# Patient Record
Sex: Male | Born: 1960 | Race: White | Hispanic: No | Marital: Married | Smoking: Never smoker
Health system: Southern US, Community
[De-identification: ages and names within clinical notes are randomized; demographics above are authoritative.]

## PROBLEM LIST (undated history)

## (undated) DIAGNOSIS — G4733 Obstructive sleep apnea (adult) (pediatric): Secondary | ICD-10-CM

## (undated) DIAGNOSIS — B351 Tinea unguium: Secondary | ICD-10-CM

## (undated) DIAGNOSIS — Z872 Personal history of diseases of the skin and subcutaneous tissue: Secondary | ICD-10-CM

## (undated) DIAGNOSIS — N503 Cyst of epididymis: Secondary | ICD-10-CM

## (undated) DIAGNOSIS — I480 Paroxysmal atrial fibrillation: Secondary | ICD-10-CM

## (undated) DIAGNOSIS — J309 Allergic rhinitis, unspecified: Secondary | ICD-10-CM

## (undated) DIAGNOSIS — Z8739 Personal history of other diseases of the musculoskeletal system and connective tissue: Secondary | ICD-10-CM

## (undated) HISTORY — DX: Cyst of epididymis: N50.3

## (undated) HISTORY — PX: SHOULDER ARTHROSCOPY: SHX128

## (undated) HISTORY — DX: Paroxysmal atrial fibrillation: I48.0

## (undated) HISTORY — DX: Tinea unguium: B35.1

## (undated) HISTORY — DX: Obstructive sleep apnea (adult) (pediatric): G47.33

## (undated) HISTORY — DX: Personal history of other diseases of the musculoskeletal system and connective tissue: Z87.39

## (undated) HISTORY — DX: Personal history of diseases of the skin and subcutaneous tissue: Z87.2

## (undated) HISTORY — DX: Allergic rhinitis, unspecified: J30.9

## (undated) HISTORY — PX: HERNIA REPAIR: SHX51

## (undated) HISTORY — PX: COLONOSCOPY: SHX174

---

## 2006-12-15 ENCOUNTER — Encounter: Admission: RE | Admit: 2006-12-15 | Discharge: 2006-12-15 | Payer: Self-pay | Admitting: Orthopedic Surgery

## 2007-12-16 ENCOUNTER — Emergency Department (HOSPITAL_BASED_OUTPATIENT_CLINIC_OR_DEPARTMENT_OTHER): Admission: EM | Admit: 2007-12-16 | Discharge: 2007-12-16 | Payer: Self-pay | Admitting: Emergency Medicine

## 2010-10-17 LAB — BASIC METABOLIC PANEL
Creatinine, Ser: 1 mg/dL (ref 0.4–1.5)
GFR calc non Af Amer: >60 mL/min (ref >60)

## 2010-10-17 LAB — DIFFERENTIAL
Basophils Absolute: 0.1 10*3/uL (ref 0.0–0.1)
Basophils Relative: 2 % — ABNORMAL HIGH (ref 0–1)
Eosinophils Absolute: 0.1 10*3/uL (ref 0.0–0.7)
Eosinophils Relative: 2 % (ref 0–5)
Lymphocytes Relative: 27 % (ref 12–46)
Lymphs Abs: 1.5 10*3/uL (ref 0.7–4.0)
Monocytes Absolute: 0.5 10*3/uL (ref 0.1–1.0)
Neutro Abs: 3.5 10*3/uL (ref 1.7–7.7)

## 2010-10-17 LAB — CBC
MCHC: 33.9 g/dL (ref 30.0–36.0)
MCV: 89 fL (ref 78.0–100.0)
Platelets: 298 10*3/uL (ref 150–400)
RBC: 4.83 MIL/uL (ref 4.22–5.81)
RDW: 12.6 % (ref 11.5–15.5)

## 2011-07-14 ENCOUNTER — Other Ambulatory Visit: Payer: Self-pay | Admitting: Otolaryngology

## 2011-07-14 DIAGNOSIS — R42 Dizziness and giddiness: Secondary | ICD-10-CM

## 2011-07-21 ENCOUNTER — Other Ambulatory Visit: Payer: Self-pay

## 2011-07-23 ENCOUNTER — Ambulatory Visit
Admission: RE | Admit: 2011-07-23 | Discharge: 2011-07-23 | Disposition: A | Discharge status: Home / Self Care | Payer: BC Managed Care – PPO | Source: Ambulatory Visit | Attending: Otolaryngology | Admitting: Otolaryngology

## 2011-07-23 DIAGNOSIS — R42 Dizziness and giddiness: Secondary | ICD-10-CM

## 2011-07-23 MED ORDER — GADOBENATE DIMEGLUMINE 529 MG/ML IV SOLN
18.0000 mL | Freq: Once | INTRAVENOUS | Status: AC | PRN
  Administered 2011-07-23: 18 mL via INTRAVENOUS

## 2011-10-19 ENCOUNTER — Encounter: Payer: Self-pay | Admitting: Internal Medicine

## 2011-10-19 ENCOUNTER — Ambulatory Visit (INDEPENDENT_AMBULATORY_CARE_PROVIDER_SITE_OTHER): Payer: BC Managed Care – PPO | Admitting: Internal Medicine

## 2011-10-19 VITALS — BP 127/75 | HR 55 | Ht 75.0 in | Wt 193.8 lb

## 2011-10-19 DIAGNOSIS — R0789 Other chest pain: Secondary | ICD-10-CM | POA: Insufficient documentation

## 2011-10-19 DIAGNOSIS — I4891 Unspecified atrial fibrillation: Secondary | ICD-10-CM | POA: Insufficient documentation

## 2011-10-19 NOTE — Assessment & Plan Note (Signed)
His symptoms are not clearly exertional. He will undergo nuclear stress testing in the next several days. If his stress test is negative, then I would not pursue additional cardiac ischemia workup.

## 2011-10-19 NOTE — Progress Notes (Signed)
HPI Shawn Fields is referred today by Dr. Dorris Fetch for evaluation of palpitations and atrial fibrillation. The patient's history dates back to an exercise treadmill test that he underwent after he turned 51 years of age. During the treadmill, he was said to go into atrial tachycardia. Initially he was not aware of his symptoms. Subsequent evaluation demonstrated normal left ventricular dysfunction with mild tricuspid and mitral valve regurgitation. Because of a history of snoring and his atrial arrhythmias, he underwent sleep evaluation demonstrating sleep apnea. The patient has subsequently undergone a cardiac monitor which demonstrates runs of atrial fibrillation, atrial tachycardia, and what appears to be atrial flutter. It is difficult to know whether he is symptomatic from these are not only does complain of intermittent palpitations. He has not had syncope. The patient notes occasional episodes of chest discomfort which are nonexertional. No relationship to diet. Initially on his beta blocker, he felt some mild fatigue and weakness. This has improved. Despite his beta blocker, he has continued palpitations. No Known Allergies   Current Outpatient Prescriptions  Medication Sig Dispense Refill  . aspirin 81 MG tablet Take 81 mg by mouth daily.      . fluticasone (FLONASE) 50 MCG/ACT nasal spray Place 2 sprays into the nose daily.      Marland Kitchen loratadine (CLARITIN) 10 MG tablet Take 10 mg by mouth daily.      . meclizine (ANTIVERT) 25 MG tablet Take 25 mg by mouth 3 (three) times daily as needed.      . metoprolol succinate (TOPROL-XL) 25 MG 24 hr tablet Take 25 mg by mouth daily.      . promethazine (PHENERGAN) 25 MG tablet Take 25 mg by mouth every 6 (six) hours as needed.         No past medical history on file.  ROS:   All systems reviewed and negative except as noted in the HPI.   No past surgical history on file.   No family history on file.   History   Social History  . Marital  Status: Married    Spouse Name: N/A    Number of Children: N/A  . Years of Education: N/A   Occupational History  . Not on file.   Social History Main Topics  . Smoking status: Never Smoker   . Smokeless tobacco: Not on file  . Alcohol Use: Not on file  . Drug Use: Not on file  . Sexually Active: Not on file   Other Topics Concern  . Not on file   Social History Narrative  . No narrative on file     BP 127/75  Pulse 55  Ht 6\' 3"  (1.905 m)  Wt 193 lb 12.8 oz (87.907 kg)  BMI 24.22 kg/m2  Physical Exam:  Well appearing middle-aged man, NAD HEENT: Unremarkable Neck:  No JVD, no thyromegally Lungs:  Clear with no wheezes, rales, or rhonchi. HEART:  Regular rate rhythm, no murmurs, no rubs, no clicks, no murmurs. Abd:  soft, positive bowel sounds, no organomegally, no rebound, no guarding Ext:  2 plus pulses, no edema, no cyanosis, no clubbing Skin:  No rashes no nodules Neuro:  CN II through XII intact, motor grossly intact  Cardiac monitor - multiple runs of atrial fibrillation, tachycardia, and atrial flutter.  Assess/Plan:

## 2011-10-19 NOTE — Assessment & Plan Note (Signed)
Today we discussed the treatment options in detail. Medical therapy including the indication for beta blockers as well as antiarrhythmic drug therapy using flecainide as well as catheter ablation were discussed. While his young age suggest that he may ultimately need catheter ablation to control his arrhythmias, I've recommended that the patient start flecainide after he has undergone stress testing which was scheduled to be done in 3 days. If he had evidence of ischemia on stress testing, and left heart catheterization would be indicated. Obviously this would preclude the use of flecainide. In this setting, a medication like Multaq would be a consideration.

## 2011-10-26 ENCOUNTER — Encounter: Payer: Self-pay | Admitting: Internal Medicine

## 2011-10-29 ENCOUNTER — Other Ambulatory Visit: Payer: Self-pay | Admitting: *Deleted

## 2011-10-29 DIAGNOSIS — I4891 Unspecified atrial fibrillation: Secondary | ICD-10-CM

## 2011-11-02 ENCOUNTER — Other Ambulatory Visit: Payer: Self-pay | Admitting: *Deleted

## 2011-11-02 MED ORDER — FLECAINIDE ACETATE 50 MG PO TABS: 50.0000 mg | ORAL_TABLET | Freq: Two times a day (BID) | ORAL | Status: DC

## 2011-11-06 ENCOUNTER — Encounter: Payer: Self-pay | Admitting: Internal Medicine

## 2011-11-10 ENCOUNTER — Encounter: Payer: Self-pay | Admitting: Internal Medicine

## 2011-11-12 ENCOUNTER — Ambulatory Visit (INDEPENDENT_AMBULATORY_CARE_PROVIDER_SITE_OTHER): Payer: BC Managed Care – PPO | Admitting: *Deleted

## 2011-11-12 ENCOUNTER — Encounter: Payer: BC Managed Care – PPO | Admitting: Internal Medicine

## 2011-11-12 ENCOUNTER — Encounter: Payer: Self-pay | Admitting: *Deleted

## 2011-11-12 VITALS — BP 120/80 | HR 52 | Resp 18

## 2011-11-12 DIAGNOSIS — I4891 Unspecified atrial fibrillation: Secondary | ICD-10-CM

## 2011-11-12 NOTE — Progress Notes (Signed)
Patient here for EKG per Dr.Taylor. He feels well and tolerating Flecainide at 50mg  2 times per day. BP was 120/80 NSR rate of 52. EKG reviewed by Dr.Taylor. Patient will return for GXT in November.

## 2011-11-12 NOTE — Patient Instructions (Signed)
Return for GXT with Dr.Taylor.

## 2011-11-20 ENCOUNTER — Ambulatory Visit (INDEPENDENT_AMBULATORY_CARE_PROVIDER_SITE_OTHER): Payer: BC Managed Care – PPO | Admitting: Internal Medicine

## 2011-11-20 DIAGNOSIS — R0789 Other chest pain: Secondary | ICD-10-CM

## 2011-11-20 DIAGNOSIS — I4891 Unspecified atrial fibrillation: Secondary | ICD-10-CM

## 2011-11-20 NOTE — Progress Notes (Signed)
Exercise Treadmill Test  Pre-Exercise Testing Evaluation Rhythm: normal sinus  Rate: 64   PR:  .17 QRS:  .10  QT:  .39 QTc: .40            Test  Exercise Tolerance Test Ordering MD: Lewayne Bunting, MD  Interpreting MD: Lewayne Bunting, MD  Unique Test No: 1  Treadmill:  1  Indication for ETT: chest pain - rule out ischemia  Contraindication to ETT: No   Stress Modality: exercise - treadmill  Cardiac Imaging Performed: non   Protocol: standard Bruce - maximal  Max BP:  173/114  Max MPHR (bpm):  170 85% MPR (bpm):  145  MPHR obtained (bpm):  129 % MPHR obtained:  75  Reached 85% MPHR (min:sec):  -- Total Exercise Time (min-sec):  12:00  Workload in METS:  13.7 Borg Scale: 14  Reason ETT Terminated:  Appropriate response reached    ST Segment Analysis At Rest: normal ST segments - no evidence of significant ST depression With Exercise: no evidence of significant ST depression  Other Information Arrhythmia:  No Angina during ETT:  absent (0) Quality of ETT:  diagnostic  ETT Interpretation:  normal - no evidence of ischemia by ST analysis  Comments: Clinically and electrically negative with no inducible arrhythmias  Recommendations: Continue flecainide and other medications.

## 2012-01-05 ENCOUNTER — Encounter: Payer: Self-pay | Admitting: Internal Medicine

## 2012-04-14 ENCOUNTER — Ambulatory Visit (INDEPENDENT_AMBULATORY_CARE_PROVIDER_SITE_OTHER): Payer: BC Managed Care – PPO | Admitting: Internal Medicine

## 2012-04-14 ENCOUNTER — Encounter: Payer: Self-pay | Admitting: Internal Medicine

## 2012-04-14 VITALS — BP 106/60 | HR 65 | Ht 75.0 in | Wt 193.2 lb

## 2012-04-14 DIAGNOSIS — R0789 Other chest pain: Secondary | ICD-10-CM

## 2012-04-14 DIAGNOSIS — I4891 Unspecified atrial fibrillation: Secondary | ICD-10-CM

## 2012-04-14 MED ORDER — FLECAINIDE ACETATE 100 MG PO TABS: ORAL_TABLET | ORAL | Status: DC

## 2012-04-14 NOTE — Patient Instructions (Addendum)
Your physician wants you to follow-up in: 6 months with Dr Court Joy will receive a reminder letter in the mail two months in advance. If you don't receive a letter, please call our office to schedule the follow-up appointment.    Your physician has recommended you make the following change in your medication:  1) Increase Flecainide to 100mg  at noon and 50mg  at night

## 2012-04-14 NOTE — Assessment & Plan Note (Signed)
His symptoms are nonexertional, and he has had a negative stress test in the past. I've recommended watchful waiting.

## 2012-04-14 NOTE — Assessment & Plan Note (Signed)
We have recommended that the patient increase his dose of flecainide to a 100 mg in the morning, and 50 mg in the evening. Additional up titration may be required.

## 2012-04-14 NOTE — Progress Notes (Signed)
HPI [name] returns today for followup. He is a very pleasant middle-age man with a history of paroxysmal atrial fibrillation, and vertigo. The patient has been on flecainide therapy at 50 mg twice daily. Over the last couple weeks, he has had increasing palpitations in the evening before he goes to bed. He denies syncope or chest pain. In addition, the patient has had problems with chronic vertigo. He notes that the room spins and he becomes dizzy and occasionally nauseated. These symptoms began over 5 years ago but have recently become more frequent. Finally, he notes an occasional dull ache in his chest. This is clearly nonexertional. No associated diaphoresis, nausea, or shortness of breath. No relationship to position. No Known Allergies   Current Outpatient Prescriptions  Medication Sig Dispense Refill  . acetic acid (VOSOL) 2 % otic solution Place 2 drops into the right ear 4 (four) times daily.       Marland Kitchen aspirin 81 MG tablet Take 81 mg by mouth daily.      . flecainide (TAMBOCOR) 100 MG tablet Take 1 tablet at lunch (12 noon) and  1/2 at night  180 tablet  3  . fluticasone (FLONASE) 50 MCG/ACT nasal spray Place 1 spray into the nose daily.       Marland Kitchen loratadine (CLARITIN) 10 MG tablet Take 10 mg by mouth daily.      . meclizine (ANTIVERT) 25 MG tablet Take 25 mg by mouth 3 (three) times daily as needed.      . metoprolol succinate (TOPROL-XL) 25 MG 24 hr tablet Take 25 mg by mouth daily.      . promethazine (PHENERGAN) 25 MG tablet Take 25 mg by mouth every 6 (six) hours as needed.       No current facility-administered medications for this visit.     History reviewed. No pertinent past medical history.  ROS:   All systems reviewed and negative except as noted in the HPI.   History reviewed. No pertinent past surgical history.   No family history on file.   History   Social History  . Marital Status: Married    Spouse Name: N/A    Number of Children: N/A  . Years of  Education: N/A   Occupational History  . Not on file.   Social History Main Topics  . Smoking status: Never Smoker   . Smokeless tobacco: Not on file  . Alcohol Use: Not on file  . Drug Use: Not on file  . Sexually Active: Not on file   Other Topics Concern  . Not on file   Social History Narrative  . No narrative on file     BP 106/60  Pulse 65  Ht 6\' 3"  (1.905 m)  Wt 193 lb 3.2 oz (87.635 kg)  BMI 24.15 kg/m2  Physical Exam:  Well appearing middle-aged man,NAD HEENT: Unremarkable Neck: 6 cm JVD, no thyromegally Lungs:  Clear with no wheezes, rales, or rhonchi. HEART:  Regular rate rhythm, no murmurs, no rubs, no clicks Abd:  soft, positive bowel sounds, no organomegally, no rebound, no guarding Ext:  2 plus pulses, no edema, no cyanosis, no clubbing Skin:  No rashes no nodules Neuro:  CN II through XII intact, motor grossly intact  EKG - sinus rhythm with normal axis and intervals  Assess/Plan:

## 2012-07-12 ENCOUNTER — Encounter: Payer: Self-pay | Admitting: Internal Medicine

## 2012-07-12 ENCOUNTER — Ambulatory Visit (INDEPENDENT_AMBULATORY_CARE_PROVIDER_SITE_OTHER): Payer: BC Managed Care – PPO | Admitting: Internal Medicine

## 2012-07-12 VITALS — BP 103/71 | HR 57 | Ht 75.0 in | Wt 191.2 lb

## 2012-07-12 DIAGNOSIS — R079 Chest pain, unspecified: Secondary | ICD-10-CM

## 2012-07-12 DIAGNOSIS — R0789 Other chest pain: Secondary | ICD-10-CM

## 2012-07-12 MED ORDER — DILTIAZEM HCL ER COATED BEADS 180 MG PO CP24: 180.0000 mg | ORAL_CAPSULE | Freq: Every day | ORAL | Status: DC

## 2012-07-12 NOTE — Progress Notes (Signed)
HPI Mr. Tift returns for followup. He is a pleasant 52 yo man with a h/o PAF who has been well controlled from an arrhythmia perspective. In the interim, he has noted chest pressure which he describes an a tightness on the left side of his chest. It is not associated with exertion or food intake and does not radiate into his neck or jaw or down his arm. He has not had syncope and has minimal if any palpitations which are not associated with his symptoms. No edema.  No Known Allergies   Current Outpatient Prescriptions  Medication Sig Dispense Refill  . acetic acid (VOSOL) 2 % otic solution Place 2 drops into the right ear 4 (four) times daily.       Marland Kitchen aspirin 81 MG tablet Take 81 mg by mouth daily.      . flecainide (TAMBOCOR) 100 MG tablet Take 1 tablet at lunch (12 noon) and  1/2 at night  180 tablet  3  . fluticasone (FLONASE) 50 MCG/ACT nasal spray Place 1 spray into the nose daily.       Marland Kitchen loratadine (CLARITIN) 10 MG tablet Take 10 mg by mouth daily.      . meclizine (ANTIVERT) 25 MG tablet Take 25 mg by mouth 3 (three) times daily as needed.      . metoprolol succinate (TOPROL-XL) 25 MG 24 hr tablet Take 25 mg by mouth daily.      . promethazine (PHENERGAN) 25 MG tablet Take 25 mg by mouth every 6 (six) hours as needed.       No current facility-administered medications for this visit.     History reviewed. No pertinent past medical history.  ROS:   All systems reviewed and negative except as noted in the HPI.   History reviewed. No pertinent past surgical history.   No family history on file.   History   Social History  . Marital Status: Married    Spouse Name: N/A    Number of Children: N/A  . Years of Education: N/A   Occupational History  . Not on file.   Social History Main Topics  . Smoking status: Never Smoker   . Smokeless tobacco: Not on file  . Alcohol Use: Not on file  . Drug Use: Not on file  . Sexually Active: Not on file   Other Topics Concern   . Not on file   Social History Narrative  . No narrative on file     BP 103/71  Pulse 57  Ht 6\' 3"  (1.905 m)  Wt 191 lb 3.2 oz (86.728 kg)  BMI 23.9 kg/m2  Physical Exam:  Well appearing middle aged man, NAD HEENT: Unremarkable Neck:  7 cm JVD, no thyromegally Lungs:  Clear with no wheezes HEART:  Regular rate rhythm, no murmurs, no rubs, no clicks Abd:  soft, positive bowel sounds, no organomegally, no rebound, no guarding Ext:  2 plus pulses, no edema, no cyanosis, no clubbing Skin:  No rashes no nodules Neuro:  CN II through XII intact, motor grossly intact  EKG - sinus bradycardia with IRBBB    Assess/Plan:

## 2012-07-12 NOTE — Patient Instructions (Signed)
Your physician has requested that you have an exercise tolerance test. For further information please visit [email or web address] Please also follow instruction sheet, as given.  Your physician has recommended you make the following change in your medication:  1) Stop Metoprolol on Wed 2) Start Cardizem 180mg  one daily on Thurs after stress test

## 2012-07-14 ENCOUNTER — Ambulatory Visit (INDEPENDENT_AMBULATORY_CARE_PROVIDER_SITE_OTHER): Payer: BC Managed Care – PPO | Admitting: Internal Medicine

## 2012-07-14 DIAGNOSIS — R079 Chest pain, unspecified: Secondary | ICD-10-CM

## 2012-07-14 NOTE — Progress Notes (Signed)
Exercise Treadmill Test  Pre-Exercise Testing Evaluation Rhythm: sinus bradycardia  Rate: 52     Test  Exercise Tolerance Test Ordering MD: Lewayne Bunting, MD  Interpreting MD: Lewayne Bunting, MD  Unique Test No: 1  Treadmill:  1  Indication for ETT: chest pain - rule out ischemia  Contraindication to ETT: No   Stress Modality: exercise - treadmill  Cardiac Imaging Performed: non   Protocol: standard Bruce - maximal  Max BP:  165/62  Max MPHR (bpm):  169 85% MPR (bpm):  144  MPHR obtained (bpm):  144 % MPHR obtained:  85  Reached 85% MPHR (min:sec):  12:00 Total Exercise Time (min-sec):  12:00  Workload in METS:  13.4 Borg Scale: 17  Reason ETT Terminated:  fatigue    ST Segment Analysis At Rest: normal ST segments - no evidence of significant ST depression With Exercise: no evidence of significant ST depression  Other Information Arrhythmia:  No Angina during ETT:  absent (0) Quality of ETT:  diagnostic  ETT Interpretation:  normal - no evidence of ischemia by ST analysis  Comments: No exercise induced arrhythmias or evidence of ischemia  Recommendations: Continue current medical therapy

## 2012-07-15 ENCOUNTER — Encounter: Payer: Self-pay | Admitting: Internal Medicine

## 2012-07-15 NOTE — Assessment & Plan Note (Signed)
The etiology of his symptoms is unclear. I doubt angina but he could have variant angina. I have recommended he undergo a regular exercise treadmill test. If no evidence of ischemia, or reproduction of his symptoms, I would consider switching his beta blocker to a calcium channel blocker. A trial of acid suppresion medication could also be considered though his symptoms are not related to oral intake or horizontal position.

## 2012-08-10 ENCOUNTER — Telehealth: Payer: Self-pay | Admitting: *Deleted

## 2012-08-10 NOTE — Telephone Encounter (Signed)
Pt came in and wanted [place] to update his medication list

## 2013-04-25 ENCOUNTER — Other Ambulatory Visit: Payer: Self-pay

## 2013-04-25 DIAGNOSIS — I4891 Unspecified atrial fibrillation: Secondary | ICD-10-CM

## 2013-04-25 MED ORDER — FLECAINIDE ACETATE 100 MG PO TABS: ORAL_TABLET | ORAL | Status: DC

## 2013-05-03 ENCOUNTER — Encounter: Payer: Self-pay | Admitting: General Surgery

## 2013-05-03 DIAGNOSIS — G4733 Obstructive sleep apnea (adult) (pediatric): Secondary | ICD-10-CM

## 2013-05-18 ENCOUNTER — Ambulatory Visit (INDEPENDENT_AMBULATORY_CARE_PROVIDER_SITE_OTHER): Payer: BC Managed Care – PPO | Admitting: Cardiology

## 2013-05-18 ENCOUNTER — Encounter: Payer: Self-pay | Admitting: Cardiology

## 2013-05-18 VITALS — BP 109/66 | HR 73 | Ht 75.0 in | Wt 182.0 lb

## 2013-05-18 DIAGNOSIS — I480 Paroxysmal atrial fibrillation: Secondary | ICD-10-CM | POA: Insufficient documentation

## 2013-05-18 DIAGNOSIS — G4733 Obstructive sleep apnea (adult) (pediatric): Secondary | ICD-10-CM

## 2013-05-18 NOTE — Progress Notes (Signed)
[facility], [place] [place], [place]  [address] Phone: [phone] Fax:  [phone]  Date:  05/18/2013   ID:  Shawn Fields, DOB 16-Apr-1960, MRN [id number]  PCP:  Gara Kroner, MD  Sleep Medicine:  Fransico Him, MD   History of Present Illness: Shawn Fields is a 53 y.o. male with a history of OSA and PAF who presents today for followup. He is doing well. He tolerates his CPAP without any problems. He tolerates the nasal pillow mask but does not like it. He wants to try another option other than CPAP.    He feels rested in the am and has no daytime sleepiness.   Wt Readings from Last 3 Encounters:  07/12/12 191 lb 3.2 oz (86.728 kg)  04/14/12 193 lb 3.2 oz (87.635 kg)  10/19/11 193 lb 12.8 oz (87.907 kg)     Past Medical History  Diagnosis Date  . Allergic rhinitis   . Onychomycosis     treated w lamisil  . H/O ganglion cyst     in the left wrist that resolved.  . H/O actinic keratosis     Treated sith Aldara periodically by Dr Ubaldo Glassing.  Marland Kitchen Epididymal cyst     h/o   . OSA (obstructive sleep apnea)     mild w AHI 14/hr now on 11cm H2O  . PAF (paroxysmal atrial fibrillation)     w CHADS VASC score 0-on ASA    Current Outpatient Prescriptions  Medication Sig Dispense Refill  . aspirin 81 MG tablet Take 81 mg by mouth daily.      Marland Kitchen diltiazem (CARDIZEM CD) 180 MG 24 hr capsule Take 1 capsule (180 mg total) by mouth daily.  90 capsule  3  . flecainide (TAMBOCOR) 100 MG tablet Take 1 tablet at lunch (12 noon) and  1/2 at night  180 tablet  1  . fluticasone (FLONASE) 50 MCG/ACT nasal spray Place 1 spray into the nose daily.       Marland Kitchen loratadine (CLARITIN) 10 MG tablet Take 10 mg by mouth daily.      . meclizine (ANTIVERT) 25 MG tablet Take 25 mg by mouth 3 (three) times daily as needed.      . promethazine (PHENERGAN) 25 MG tablet Take 25 mg by mouth every 6 (six) hours as needed.       No current facility-administered medications for this visit.    Allergies:   No  Known Allergies  Social History:  The patient  reports that he has never smoked. He does not have any smokeless tobacco history on file. He reports that he drinks alcohol. He reports that he does not use illicit drugs.   Family History:  The patient's family history is not on file.   ROS:  Please see the history of present illness.      All other systems reviewed and negative.   PHYSICAL EXAM: VS:  There were no vitals taken for this visit. Well nourished, well developed, in no acute distress HEENT: normal Neck: no JVD Cardiac:  normal S1, S2; RRR; no murmur Lungs:  clear to auscultation bilaterally, no wheezing, rhonchi or rales Abd: soft, nontender, no hepatomegaly Ext: no edema Skin: warm and dry Neuro:  CNs 2-12 intact, no focal abnormalities noted   ASSESSMENT AND PLAN:  1. OSA on CPAP and doing well but he does not like using it.  He would like to be evaluated by ENT to see if there is a surgical etiology  to his sleep apnea and if not he would like to be referred to dentistry for oral device.  Followup with me in 6 months  Signed, Fransico Him, MD

## 2013-05-18 NOTE — Patient Instructions (Addendum)
Your physician recommends that you continue on your current medications as directed. Please refer to the Current Medication list given to you today.  You have been referred to Dr Melissa Montane at [facility] [address], [place], [place] [address] [phone]  Your physician wants you to follow-up in: 6 months with Dr Mallie Snooks will receive a reminder letter in the mail two months in advance. If you don't receive a letter, please call our office to schedule the follow-up appointment.

## 2013-07-04 ENCOUNTER — Other Ambulatory Visit: Payer: Self-pay

## 2013-07-04 DIAGNOSIS — R0789 Other chest pain: Secondary | ICD-10-CM

## 2013-07-04 MED ORDER — DILTIAZEM HCL ER COATED BEADS 180 MG PO CP24: 180.0000 mg | ORAL_CAPSULE | Freq: Every day | ORAL | Status: DC

## 2013-07-12 ENCOUNTER — Other Ambulatory Visit: Payer: Self-pay | Admitting: Family Medicine

## 2013-07-12 DIAGNOSIS — IMO0002 Reserved for concepts with insufficient information to code with codable children: Secondary | ICD-10-CM

## 2013-07-12 DIAGNOSIS — R229 Localized swelling, mass and lump, unspecified: Principal | ICD-10-CM

## 2013-07-17 ENCOUNTER — Other Ambulatory Visit: Payer: BC Managed Care – PPO

## 2013-07-26 ENCOUNTER — Ambulatory Visit
Admission: RE | Admit: 2013-07-26 | Discharge: 2013-07-26 | Disposition: A | Discharge status: Home / Self Care | Payer: BC Managed Care – PPO | Source: Ambulatory Visit | Attending: Family Medicine | Admitting: Family Medicine

## 2013-07-26 DIAGNOSIS — R229 Localized swelling, mass and lump, unspecified: Principal | ICD-10-CM

## 2013-07-26 DIAGNOSIS — IMO0002 Reserved for concepts with insufficient information to code with codable children: Secondary | ICD-10-CM

## 2013-07-31 ENCOUNTER — Encounter: Payer: Self-pay | Admitting: Internal Medicine

## 2013-07-31 ENCOUNTER — Ambulatory Visit (INDEPENDENT_AMBULATORY_CARE_PROVIDER_SITE_OTHER): Payer: BC Managed Care – PPO | Admitting: Internal Medicine

## 2013-07-31 VITALS — BP 120/70 | HR 56 | Ht 74.0 in | Wt 186.0 lb

## 2013-07-31 DIAGNOSIS — R0789 Other chest pain: Secondary | ICD-10-CM

## 2013-07-31 DIAGNOSIS — I4891 Unspecified atrial fibrillation: Secondary | ICD-10-CM

## 2013-07-31 DIAGNOSIS — I480 Paroxysmal atrial fibrillation: Secondary | ICD-10-CM

## 2013-07-31 DIAGNOSIS — G4733 Obstructive sleep apnea (adult) (pediatric): Secondary | ICD-10-CM

## 2013-07-31 NOTE — Patient Instructions (Signed)
Your physician has recommended you make the following change in your medication:  1) Take Cardizem every other day for 6 days - then hold medication for 2 weeks -- if no improvement, restart Cardizem.  Your physician wants you to follow-up in: 6 months with Dr Lovena Le.  You will receive a reminder letter in the mail two months in advance. If you don't receive a letter, please call our office to schedule the follow-up appointment.

## 2013-07-31 NOTE — Progress Notes (Signed)
HPI Mr. Bachtel returns for followup. He is a pleasant 53 yo man with a h/o PAF who has been well controlled from an arrhythmia perspective. In the interim, he has minimal if any chest tightness. He notes some problems with reduced libido. His testosterone level is low normal.  He has not had syncope. No edema. He has had trouble wearing his CPAP and is undergoing evaluation by his ENT.  No Known Allergies   Current Outpatient Prescriptions  Medication Sig Dispense Refill  . aspirin 81 MG tablet Take 81 mg by mouth daily.      Marland Kitchen diltiazem (CARDIZEM CD) 180 MG 24 hr capsule Take 1 capsule (180 mg total) by mouth daily.  90 capsule  3  . diphenhydrAMINE (BENADRYL) 25 MG tablet As needed for allergies      . flecainide (TAMBOCOR) 100 MG tablet Take 1 tablet at lunch (12 noon) and  1/2 at night  180 tablet  1  . fluticasone (FLONASE) 50 MCG/ACT nasal spray Place 1 spray into the nose daily.       Marland Kitchen loratadine (CLARITIN) 10 MG tablet Take 10 mg by mouth daily.      . meclizine (ANTIVERT) 25 MG tablet Take 25 mg by mouth 3 (three) times daily as needed.      . promethazine (PHENERGAN) 25 MG tablet Take 25 mg by mouth every 6 (six) hours as needed.       No current facility-administered medications for this visit.     Past Medical History  Diagnosis Date  . Allergic rhinitis   . Onychomycosis     treated w lamisil  . H/O ganglion cyst     in the left wrist that resolved.  . H/O actinic keratosis     Treated sith Aldara periodically by Dr Ubaldo Glassing.  Marland Kitchen Epididymal cyst     h/o   . OSA (obstructive sleep apnea)     mild w AHI 14/hr now on 11cm H2O  . PAF (paroxysmal atrial fibrillation)     w CHADS VASC score 0-on ASA    ROS:   All systems reviewed and negative except as noted in the HPI.   History reviewed. No pertinent past surgical history.   Family History  Problem Relation Age of Onset  . Parkinson's disease Mother   . Heart attack Father   . Heart disease Father   .  Arrhythmia Brother      History   Social History  . Marital Status: Married    Spouse Name: N/A    Number of Children: N/A  . Years of Education: N/A   Occupational History  . Not on file.   Social History Main Topics  . Smoking status: Never Smoker   . Smokeless tobacco: Not on file  . Alcohol Use: Yes     Comment: occasionally  . Drug Use: No  . Sexual Activity: Not on file   Other Topics Concern  . Not on file   Social History Narrative  . No narrative on file     BP 120/70  Pulse 56  Ht 6\' 2"  (1.88 m)  Wt 186 lb (84.369 kg)  BMI 23.87 kg/m2  Physical Exam:  Well appearing middle aged man, NAD HEENT: Unremarkable Neck:  7 cm JVD, no thyromegally Lungs:  Clear with no wheezes HEART:  Regular rate rhythm, no murmurs, no rubs, no clicks Abd:  soft, positive bowel sounds, no organomegally, no rebound, no guarding Ext:  2 plus pulses, no edema,  no cyanosis, no clubbing Skin:  No rashes no nodules Neuro:  CN II through XII intact, motor grossly intact  EKG - sinus bradycardia with IRBBB    Assess/Plan:

## 2013-07-31 NOTE — Assessment & Plan Note (Signed)
He has had trouble wearing his CPAP mask. He will followup with his ENT.

## 2013-07-31 NOTE — Assessment & Plan Note (Signed)
He appears to be maintaining NSR very nicely. The patient complains of reduced libido and wonders if any of his meds could be contributing. I have asked him to wean off of cardizem for 2-3 weeks to see if his cardizem could be playing a role. I suspect it will make no difference. He is instructed to call us with the outcome.

## 2013-08-16 ENCOUNTER — Encounter: Payer: Self-pay | Admitting: Cardiology

## 2013-11-14 ENCOUNTER — Encounter: Payer: Self-pay | Admitting: Cardiology

## 2013-11-14 ENCOUNTER — Ambulatory Visit (INDEPENDENT_AMBULATORY_CARE_PROVIDER_SITE_OTHER): Payer: BC Managed Care – PPO | Admitting: Cardiology

## 2013-11-14 VITALS — BP 112/70 | HR 71 | Ht 75.0 in | Wt 186.6 lb

## 2013-11-14 DIAGNOSIS — G4733 Obstructive sleep apnea (adult) (pediatric): Secondary | ICD-10-CM

## 2013-11-14 NOTE — Patient Instructions (Signed)
Your physician wants you to follow-up in: 6 months with Dr. Radford Pax. You will receive a reminder letter in the mail two months in advance. If you don't receive a letter, please call our office to schedule the follow-up appointment.  Call us and let us know when you are having your nasal surgery.   Your physician recommends that you continue on your current medications as directed. Please refer to the Current Medication list given to you today.

## 2013-11-14 NOTE — Progress Notes (Signed)
[address], [place], [place]  [address] Phone: [phone] Fax:  [phone]  Date:  11/14/2013   ID:  Shawn Fields, DOB 07-20-1960, MRN [id number]  PCP:  Gara Kroner, MD  Cardiologist:  Fransico Him, MD    History of Present Illness: Shawn Fields is a 53 y.o. male with a history of OSA and PAF who presents today for followup. He is doing well. He tolerates his CPAP without any problems. He tolerates the nasal pillow mask but does not like it. When I saw him last he wanted to try another option other than CPAP. I referred him to ENT for evaluation.  He has seen Dr. Janace Hoard who felt that he would benefit greatly from nasal surgery for deviated nasal septum.  He is still using his CPAP but takes it off in the middle of the night.  He is not real happy about having surgery but does not want to keep using the mask.  He would like to try a different nasal pillow mask.  He feels tired in the am but has not been using the mask all night.   Wt Readings from Last 3 Encounters:  11/14/13 186 lb 9.6 oz (84.641 kg)  07/31/13 186 lb (84.369 kg)  05/18/13 182 lb (82.555 kg)     Past Medical History  Diagnosis Date  . Allergic rhinitis   . Onychomycosis     treated w lamisil  . H/O ganglion cyst     in the left wrist that resolved.  . H/O actinic keratosis     Treated sith Aldara periodically by Dr Ubaldo Glassing.  Marland Kitchen Epididymal cyst     h/o   . OSA (obstructive sleep apnea)     mild w AHI 14/hr now on 11cm H2O  . PAF (paroxysmal atrial fibrillation)     w CHADS VASC score 0-on ASA    Current Outpatient Prescriptions  Medication Sig Dispense Refill  . aspirin 81 MG tablet Take 81 mg by mouth daily.    . diphenhydrAMINE (BENADRYL) 25 MG tablet As needed for allergies    . flecainide (TAMBOCOR) 100 MG tablet Take 1 tablet at lunch (12 noon) and  1/2 at night 180 tablet 1  . fluticasone (FLONASE) 50 MCG/ACT nasal spray Place 1 spray into the nose daily.     Marland Kitchen loratadine  (CLARITIN) 10 MG tablet Take 10 mg by mouth daily.    . meclizine (ANTIVERT) 25 MG tablet Take 25 mg by mouth 3 (three) times daily as needed.    . promethazine (PHENERGAN) 25 MG tablet Take 25 mg by mouth every 6 (six) hours as needed.    . diltiazem (CARDIZEM CD) 180 MG 24 hr capsule Take 1 capsule (180 mg total) by mouth daily. 90 capsule 3   No current facility-administered medications for this visit.    Allergies:   No Known Allergies  Social History:  The patient  reports that he has never smoked. He does not have any smokeless tobacco history on file. He reports that he drinks alcohol. He reports that he does not use illicit drugs.   Family History:  The patient's family history includes Arrhythmia in his brother; Heart attack in his father; Heart disease in his father; Parkinson's disease in his mother.   ROS:  Please see the history of present illness.      All other systems reviewed and negative.   PHYSICAL EXAM: VS:  BP 112/70 mmHg  Pulse 71  Ht 6\' 3"  (1.905 m)  Wt 186 lb 9.6 oz (84.641 kg)  BMI 23.32 kg/m2 Well nourished, well developed, in no acute distress HEENT: normal Neck: no JVD Cardiac:  normal S1, S2; RRR; no murmur Lungs:  clear to auscultation bilaterally, no wheezing, rhonchi or rales Abd: soft, nontender, no hepatomegaly Ext: no edema Skin: warm and dry Neuro:  CNs 2-12 intact, no focal abnormalities noted  ASSESSMENT AND PLAN:  1. OSA on CPAP.  His download today showed an AHI of 2.1/hr on 11cm H2O and 24% compliance in using more than 4 hours nightly.  He is not compliant because he is taking the mask off in the middle of the night.  I have encouraged him to proceed with the nasal surgery in hopes that we can get him off of CPAP.  He wants to try the nasal pillow mask to get him over the holidays and then consider surgery.  We will plan on repeating a sleep study once he is 2 months out from his nasal surgery.  In review of his initial sleep study he has  mixed central sleep apnea and hypopneas.  He had 21 central apneas, and 51 mixed apneas/hypopneas.  Hopefully if we can gets his sinus obstructive resolved the small number of central apneas will be insignificant and he will not need any further therapy.  Followup with me in 6 months       Signed, Fransico Him, MD [facility] 11/14/2013 9:54 AM

## 2013-11-16 ENCOUNTER — Telehealth: Payer: Self-pay

## 2013-11-16 NOTE — Telephone Encounter (Signed)
Left message to fax Dr. Janace Hoard' last office note to Dr. Radford Pax at [phone].

## 2013-12-17 ENCOUNTER — Other Ambulatory Visit: Payer: Self-pay | Admitting: Internal Medicine

## 2013-12-19 ENCOUNTER — Encounter: Payer: Self-pay | Admitting: Internal Medicine

## 2013-12-19 ENCOUNTER — Ambulatory Visit (INDEPENDENT_AMBULATORY_CARE_PROVIDER_SITE_OTHER): Payer: BC Managed Care – PPO | Admitting: Internal Medicine

## 2013-12-19 VITALS — BP 98/80 | HR 65 | Ht 75.0 in | Wt 186.4 lb

## 2013-12-19 DIAGNOSIS — I480 Paroxysmal atrial fibrillation: Secondary | ICD-10-CM

## 2013-12-19 DIAGNOSIS — R0789 Other chest pain: Secondary | ICD-10-CM

## 2013-12-19 DIAGNOSIS — G4733 Obstructive sleep apnea (adult) (pediatric): Secondary | ICD-10-CM

## 2013-12-19 MED ORDER — FLECAINIDE ACETATE 100 MG PO TABS: ORAL_TABLET | ORAL | Status: DC

## 2013-12-19 NOTE — Assessment & Plan Note (Signed)
For the most part, his atrial fibrillation has been well-controlled. The 2 episodes that he appears to have had 2 weeks ago are worth evaluating further. Because breath occurred with exertion, I have recommended that he undergo exercise treadmill testing.

## 2013-12-19 NOTE — Progress Notes (Signed)
HPI [name] returns today for followup. He is a pleasant 53 yo man with a h/o PAF and sleep apnea. He has done well over the past few months but has had 2 episodes of palpitations since then occuring while he was exercising. Each episode lasted approx. 1-2 minutes. No syncope. He has also noted some chest soreness which is not exertional. Finally, he has had some diffiuclty with vigorous exertion where he gets sob. I note the findings of Dr. Theodosia Blender sleep evaluation where he has had difficulty with compliance with his CPAP. He is considering nasal surgery.  No Known Allergies   Current Outpatient Prescriptions  Medication Sig Dispense Refill  . aspirin 81 MG tablet Take 81 mg by mouth daily.    . flecainide (TAMBOCOR) 100 MG tablet Take one tablet by mouth at noon and one-half tablet at night (Patient taking differently: Take 50 mg by mouth at lunch and take 100 mg by mouth at night) 135 tablet 0  . fluticasone (FLONASE) 50 MCG/ACT nasal spray Place 1 spray into the nose daily.     Marland Kitchen loratadine (CLARITIN) 10 MG tablet Take 10 mg by mouth daily.    . meclizine (ANTIVERT) 25 MG tablet Take 25 mg by mouth 3 (three) times daily as needed for dizziness or nausea.     . promethazine (PHENERGAN) 25 MG tablet Take 25 mg by mouth every 6 (six) hours as needed for nausea or vomiting.     . diphenhydrAMINE (BENADRYL) 25 MG tablet Take 25 mg by mouth every 6 (six) hours as needed for allergies.      No current facility-administered medications for this visit.     Past Medical History  Diagnosis Date  . Allergic rhinitis   . Onychomycosis     treated w lamisil  . H/O ganglion cyst     in the left wrist that resolved.  . H/O actinic keratosis     Treated sith Aldara periodically by Dr Ubaldo Glassing.  Marland Kitchen Epididymal cyst     h/o   . OSA (obstructive sleep apnea)     mild w AHI 14/hr now on 11cm H2O  . PAF (paroxysmal atrial fibrillation)     w CHADS VASC score 0-on ASA    ROS:   All  systems reviewed and negative except as noted in the HPI.   No past surgical history on file.   Family History  Problem Relation Age of Onset  . Parkinson's disease Mother   . Heart attack Father   . Heart disease Father   . Arrhythmia Brother      History   Social History  . Marital Status: Married    Spouse Name: N/A    Number of Children: N/A  . Years of Education: N/A   Occupational History  . Not on file.   Social History Main Topics  . Smoking status: Never Smoker   . Smokeless tobacco: Not on file  . Alcohol Use: Yes     Comment: occasionally  . Drug Use: No  . Sexual Activity: Not on file   Other Topics Concern  . Not on file   Social History Narrative     BP 98/80 mmHg  Pulse 65  Ht 6\' 3"  (1.905 m)  Wt 186 lb 6.4 oz (84.55 kg)  BMI 23.30 kg/m2  Physical Exam:  Well appearing middle aged man, NAD HEENT: Unremarkable Neck:  No JVD, no thyromegally Lymphatics:  No adenopathy Back:  No CVA  tenderness Lungs:  Clear with no wheezes, rales, or rhonchi. HEART:  Regular rate rhythm, no murmurs, no rubs, no clicks, there is some chest soreness with palpitation which is reproducible  Abd:  soft, positive bowel sounds, no organomegally, no rebound, no guarding Ext:  2 plus pulses, no edema, no cyanosis, no clubbing Skin:  No rashes no nodules Neuro:  CN II through XII intact, motor grossly intact  EKG - normal sinus rhythm, QRS duration 100 ms.  Assess/Plan:

## 2013-12-19 NOTE — Patient Instructions (Signed)
Your physician has requested that you have an exercise tolerance test. For further information please visit [email or web address]. Please also follow instruction sheet, as given.   

## 2013-12-19 NOTE — Assessment & Plan Note (Signed)
His symptoms are not typical for angina, but he does have some cardiac risk factors, and he will undergo exercise treadmill testing for evaluation. No change in medications today.

## 2013-12-19 NOTE — Addendum Note (Signed)
Addended by: Elberta Leatherwood R on: 12/19/2013 09:59 AM   Modules accepted: Orders, Medications

## 2013-12-19 NOTE — Assessment & Plan Note (Signed)
He is considering nasal surgery as a means to help with the obstructive symptoms he is experiencing. He has been intolerant of his sleep apnea. I have encourage the patient and think he would be a low surgical risk, though will also plan exercise stress testing as noted below.

## 2013-12-26 ENCOUNTER — Ambulatory Visit (INDEPENDENT_AMBULATORY_CARE_PROVIDER_SITE_OTHER): Payer: BC Managed Care – PPO | Admitting: Internal Medicine

## 2013-12-26 DIAGNOSIS — I480 Paroxysmal atrial fibrillation: Secondary | ICD-10-CM

## 2013-12-26 NOTE — Progress Notes (Signed)
Exercise Treadmill Test  Pre-Exercise Testing Evaluation Rhythm: normal sinus  Rate: 61 bpm     Test  Exercise Tolerance Test Ordering MD: [name], MD  Interpreting MD: [name], MD  Unique Test No: 1  Treadmill:  1  Indication for ETT: afib  Contraindication to ETT: No   Stress Modality: exercise - treadmill  Cardiac Imaging Performed: non   Protocol: standard Bruce - maximal  Max BP:  174/67  Max MPHR (bpm):  168 85% MPR (bpm):  143  MPHR obtained (bpm):  164 % MPHR obtained:  97  Reached 85% MPHR (min:sec):  11:27 Total Exercise Time (min-sec):  13:00  Workload in METS:  15.2 Borg Scale: 17  Reason ETT Terminated:  desired heart rate attained    ST Segment Analysis At Rest: normal ST segments - no evidence of significant ST depression With Exercise: no evidence of significant ST depression  Other Information Arrhythmia:  No Angina during ETT:  absent (0) Quality of ETT:  diagnostic  ETT Interpretation:  normal - no evidence of ischemia by ST analysis  Comments: Clinically and electrically negative  Recommendations: 1. Continue current meds 2. Return to exercise 3.May take additional flecainide as needed for breakthrough arrhythmias.  Mikle Bosworth.D.

## 2014-02-06 ENCOUNTER — Ambulatory Visit: Payer: BC Managed Care – PPO | Admitting: Internal Medicine

## 2014-07-30 ENCOUNTER — Telehealth: Payer: Self-pay

## 2014-07-30 ENCOUNTER — Encounter: Payer: Self-pay | Admitting: Neurology

## 2014-07-30 ENCOUNTER — Ambulatory Visit (INDEPENDENT_AMBULATORY_CARE_PROVIDER_SITE_OTHER): Payer: BLUE CROSS/BLUE SHIELD | Admitting: Neurology

## 2014-07-30 VITALS — BP 104/65 | HR 65 | Ht 75.0 in | Wt 189.8 lb

## 2014-07-30 DIAGNOSIS — H539 Unspecified visual disturbance: Secondary | ICD-10-CM | POA: Insufficient documentation

## 2014-07-30 DIAGNOSIS — G43809 Other migraine, not intractable, without status migrainosus: Secondary | ICD-10-CM

## 2014-07-30 DIAGNOSIS — H5347 Heteronymous bilateral field defects: Secondary | ICD-10-CM

## 2014-07-30 NOTE — Progress Notes (Signed)
[facility] [address] [place]. [place] [address] [phone]       OFFICE CONSULT NOTE  Shawn. Shawn Fields Date of Birth:  [birth year] Medical Record Number:  [id number]   Referring MD:  Crissie Sickles  Reason for Referral: blurred vision  HPI: Shawn Fields is a 29 year pleasant Caucasian male who had one brief episode of sudden onset of partial vision loss in the left eye on 07/22/14. He was at church exhibiting when on lady dropped a bag containing some things on the steps. He leaned down to help gather the staff and as he stood up he notices blurred vision in the left eye. He is quite specific and states that only the outer aspect of the left eye was blurred and he could see things closer to the nose and that there was a vertical line in his left eye in the center clearly separating the outer blurred vision from the more medial clear vision He did not however closed either eye. This lasted about 20 minutes. He took it easy for the rest of the day. He does admit the day prior to this happening he had worked out in the yard and it was a hot day. At that time also while bending over and he stood up. Noticed some waxy lines moving across both visual fields which lasted only a few minutes and went away after resting. He did have a dull periorbital headache and that time as well. Patient denies any known prior history of migraine headaches or visual migraines. He feels however his dad and mother both may have had migraines. Patient denies any prior history of strokes TIAs or significant neurological problems. He does have a history of paroxysmal atrial fibrillation which was discovered while he was doing a stress test. He is had very occasional palpitation episodes off-and-on but he has been on flecainide which seems to be controlling it quite well. He was seen 3 years ago by Dr. Lucia Bitter [facility] office for vertigo that time brain imaging is unremarkable. I personally reviewed his MRI films  from 2013 which were normal. He was also seen by Dr. Janace Hoard from ENT with no specific diagnosis was made. His symptoms fluctuated for a few months but since then have resolved and is not had any recurrent vertigo episode. He has a diagnosis of sleep apnea and does use a CPAP though he struggled with it for compliance and is considering nasal surgery. He has not had any recent lab work or brain imaging studies done.  ROS:   14 system review of systems is positive for blurred vision, apnea, snoring, palpitations and all other systems negative  PMH:  Past Medical History  Diagnosis Date  . Allergic rhinitis   . Onychomycosis     treated w lamisil  . H/O ganglion cyst     in the left wrist that resolved.  . H/O actinic keratosis     Treated sith Aldara periodically by Dr Ubaldo Glassing.  Marland Kitchen Epididymal cyst     h/o   . OSA (obstructive sleep apnea)     mild w AHI 14/hr now on 11cm H2O  . PAF (paroxysmal atrial fibrillation)     w CHADS VASC score 0-on ASA  . Sleep apnea     Social History:  History   Social History  . Marital Status: Married    Spouse Name: N/A  . Number of Children: 2  . Years of Education: 16   Occupational History  .  [employer]     Social History Main Topics  . Smoking status: Never Smoker   . Smokeless tobacco: Never Used  . Alcohol Use: Yes     Comment: occasionally  . Drug Use: No  . Sexual Activity: Not on file   Other Topics Concern  . Not on file   Social History Narrative   Drinks 1 cup a caffeine a day        Medications:   Current Outpatient Prescriptions on File Prior to Visit  Medication Sig Dispense Refill  . aspirin 81 MG tablet Take 10 mg by mouth daily.     . flecainide (TAMBOCOR) 100 MG tablet Take 50 mg by mouth at lunch and take 100 mg by mouth at night 135 tablet 2  . fluticasone (FLONASE) 50 MCG/ACT nasal spray Place 1 spray into the nose daily.     Marland Kitchen loratadine (CLARITIN) 10 MG tablet Take 10 mg by mouth daily.    . meclizine  (ANTIVERT) 25 MG tablet Take 25 mg by mouth 3 (three) times daily as needed for dizziness or nausea.     . promethazine (PHENERGAN) 25 MG tablet Take 25 mg by mouth every 6 (six) hours as needed for nausea or vomiting.     . diphenhydrAMINE (BENADRYL) 25 MG tablet Take 25 mg by mouth every 6 (six) hours as needed for allergies.      No current facility-administered medications on file prior to visit.    Allergies:  No Known Allergies  Physical Exam General: well developed, well nourished middle aged Caucasian male, seated, in no evident distress Head: head normocephalic and atraumatic.   Neck: supple with no carotid or supraclavicular bruits Cardiovascular: regular rate and rhythm, no murmurs Musculoskeletal: no deformity Skin:  no rash/petichiae Vascular:  Normal pulses all extremities  Neurologic Exam Mental Status: Awake and fully alert. Oriented to place and time. Recent and remote memory intact. Attention span, concentration and fund of knowledge appropriate. Mood and affect appropriate.  Cranial Nerves: Fundoscopic exam difficult due to patient's unable to keep her eyes open but reveals sharp disc margins. Pupils equal, briskly reactive to light. Extraocular movements full without nystagmus. Visual fields full to confrontation. Hearing intact. Facial sensation intact. Face, tongue, palate moves normally and symmetrically.  Motor: Normal bulk and tone. Normal strength in all tested extremity muscles. Sensory.: intact to touch , pinprick , position and vibratory sensation.  Coordination: Rapid alternating movements normal in all extremities. Finger-to-nose and heel-to-shin performed accurately bilaterally. Gait and Station: Arises from chair without difficulty. Stance is normal. Gait demonstrates normal stride length and balance . Able to heel, toe and tandem walk without difficulty.  Reflexes: 1+ and symmetric. Toes downgoing.   NIHSS 0 Modified Rankin 0   ASSESSMENT: 18 year  Caucasian male with transient episode of partial left eye vision loss preceded by a dull periorbital and frontal headache possibilities include ocular migraine versus right brain TIA. Temporal arteritis or ischemic optic neuropathy would be less likely Vascular risk factors of paroxysmal atrial fibrillation only     PLAN: I had a long discussion with the patient with regards to his symptoms of transient vision disturbance in the left eye and discuss results of my clinical evaluation, differential diagnosis and answered questions. The episode sounds like possible ocular migraine but since he has no preceding history of migraines and has history of atrial fibrillation would pursue further cerebrovascular evaluation by checking MRI scan of the brain, MRA of the brain and neck.  Increase aspirin to 325 mg daily and check fasting lipid profile, hemoglobin A 1C, ESR. I have advised him to drink adequate fluid, avoid dehydration and call me if he has recurrent symptoms. Incase MRI reveals an occipital or other silent cerebral infarcts may need to change aspirin to a novel anticoagulant.. If above workup is unyielding may need to refer him to an ophthalmologist for detailed retinal  evaluation in the left eye Return for follow-up in 2 months or call earlier if necessary.  Antony Contras, MD Note: This document was prepared with digital dictation and possible smart phrase technology. Any transcriptional errors that result from this process are unintentional.

## 2014-07-30 NOTE — Patient Instructions (Signed)
I had a long discussion with the patient with regards to his symptoms of transient vision disturbance in the left eye and discuss results of my clinical evaluation, differential diagnosis and answered questions. The episode sounds like possible ocular migraine but since he has no preceding history of migraines and has history of atrial fibrillation would pursue further cerebrovascular evaluation by checking MRI scan of the brain, MRA of the brain and neck. Increase aspirin to 325 mg daily and check fasting lipid profile, hemoglobin A 1C, ESR. I have advised him to drink adequate fluid, avoid dehydration and call me if he has recurrent symptoms. Return for follow-up in 2 months or call earlier if necessary.

## 2014-07-30 NOTE — Telephone Encounter (Signed)
Pt called back to confirm new appt. Date and time. He will be here.

## 2014-07-30 NOTE — Telephone Encounter (Signed)
LVM for patient to call office back, patient has been scheduled for 11am Monday 07/30/2014 for NP appointment.  He has be instructed to call office back if this will not work for him.

## 2014-07-31 ENCOUNTER — Telehealth: Payer: Self-pay

## 2014-07-31 LAB — LIPID PANEL
Chol/HDL Ratio: 3.5 ratio units (ref 0.0–5.0)
Cholesterol, Total: 163 mg/dL (ref 100–199)
HDL: 47 mg/dL (ref >39)
LDL CALC: 90 mg/dL (ref 0–99)
Triglycerides: 132 mg/dL (ref 0–149)
VLDL CHOLESTEROL CAL: 26 mg/dL (ref 5–40)

## 2014-07-31 LAB — SEDIMENTATION RATE: Sed Rate: 2 mm/hr (ref 0–30)

## 2014-07-31 LAB — HEMOGLOBIN A1C
Est. average glucose Bld gHb Est-mCnc: 105 mg/dL
Hgb A1c MFr Bld: 5.3 % (ref 4.8–5.6)

## 2014-07-31 NOTE — Telephone Encounter (Signed)
LVM for patient, explaining that lab work (sedimentation rate, lipid profile and hemoglobin A1c) were all normal.  Patient has been instructed to call office back with any questions or concerns.

## 2014-08-06 ENCOUNTER — Ambulatory Visit (INDEPENDENT_AMBULATORY_CARE_PROVIDER_SITE_OTHER): Payer: BLUE CROSS/BLUE SHIELD | Admitting: Internal Medicine

## 2014-08-06 ENCOUNTER — Encounter: Payer: Self-pay | Admitting: Internal Medicine

## 2014-08-06 VITALS — BP 110/64 | HR 63 | Ht 75.0 in | Wt 189.6 lb

## 2014-08-06 DIAGNOSIS — I480 Paroxysmal atrial fibrillation: Secondary | ICD-10-CM

## 2014-08-06 DIAGNOSIS — G4733 Obstructive sleep apnea (adult) (pediatric): Secondary | ICD-10-CM | POA: Diagnosis not present

## 2014-08-06 DIAGNOSIS — H539 Unspecified visual disturbance: Secondary | ICD-10-CM

## 2014-08-06 NOTE — Patient Instructions (Signed)
Medication Instructions:  Your physician recommends that you continue on your current medications as directed. Please refer to the Current Medication list given to you today.   Labwork: NONE  Testing/Procedures: NONE  Follow-Up: Your physician wants you to follow-up in: 6 months with Dr. Taylor. You will receive a reminder letter in the mail two months in advance. If you don't receive a letter, please call our office to schedule the follow-up appointment.   Any Other Special Instructions Will Be Listed Below (If Applicable).   

## 2014-08-07 ENCOUNTER — Ambulatory Visit: Payer: Self-pay | Admitting: Internal Medicine

## 2014-08-12 NOTE — Assessment & Plan Note (Signed)
He appears to be maintaining NSR. He will continue his current meds. 

## 2014-08-12 NOTE — Assessment & Plan Note (Signed)
He admits to difficulty with compliance with his CPAP.

## 2014-08-12 NOTE — Progress Notes (Signed)
HPI [name] returns today for followup. He is a pleasant 54 yo man with a h/o PAF and sleep apnea. He has done well over the past few months and has been essentially freee of atrial fibrillation. He notes an episode several weeks ago where he had a change of vision lasting about 20 minutes. He called me about this and I have referred him to Dr. Leonie Man. The patient has had no additional episodes and is pending MRI scanning. He is currently on full strength ASA. His CHADSVASC score is low unless he is ultimately thought to have a TIA/Stroke.  No Known Allergies   Current Outpatient Prescriptions  Medication Sig Dispense Refill  . aspirin 325 MG tablet Take 325 mg by mouth daily.    . clindamycin (CLEOCIN T) 1 % lotion apply to affected area twice a day  0  . diphenhydrAMINE (BENADRYL) 25 MG tablet Take 25 mg by mouth every 6 (six) hours as needed for allergies.     . flecainide (TAMBOCOR) 100 MG tablet Take 100 mg by mouth 2 (two) times daily. Pt takes 100 mg by mouth at lunch and 50 mg by mouth at night.    . fluticasone (FLONASE) 50 MCG/ACT nasal spray Place 1 spray into the nose daily.     . imiquimod (ALDARA) 5 % cream apply to affected area three times a week  0  . loratadine (CLARITIN) 10 MG tablet Take 10 mg by mouth daily.    . meclizine (ANTIVERT) 25 MG tablet Take 25 mg by mouth 3 (three) times daily as needed for dizziness or nausea.     Marland Kitchen neomycin-polymyxin-hydrocortisone (CORTISPORIN) 3.5-10000-1 otic suspension Place 4 drops into both ears 4 (four) times daily.    . promethazine (PHENERGAN) 25 MG tablet Take 25 mg by mouth every 6 (six) hours as needed for nausea or vomiting.      No current facility-administered medications for this visit.     Past Medical History  Diagnosis Date  . Allergic rhinitis   . Onychomycosis     treated w lamisil  . H/O ganglion cyst     in the left wrist that resolved.  . H/O actinic keratosis     Treated sith Aldara periodically by  Dr Ubaldo Glassing.  Marland Kitchen Epididymal cyst     h/o   . OSA (obstructive sleep apnea)     mild w AHI 14/hr now on 11cm H2O  . PAF (paroxysmal atrial fibrillation)     w CHADS VASC score 0-on ASA  . Sleep apnea     ROS:   All systems reviewed and negative except as noted in the HPI.   History reviewed. No pertinent past surgical history.   Family History  Problem Relation Age of Onset  . Parkinson's disease Mother   . Heart attack Father   . Heart disease Father   . Arrhythmia Brother      History   Social History  . Marital Status: Married    Spouse Name: N/A  . Number of Children: 2  . Years of Education: 16   Occupational History  . [employer]     Social History Main Topics  . Smoking status: Never Smoker   . Smokeless tobacco: Never Used  . Alcohol Use: Yes     Comment: occasionally  . Drug Use: No  . Sexual Activity: Not on file   Other Topics Concern  . Not on file   Social History Narrative  Drinks 1 cup a caffeine a day         BP 110/64 mmHg  Pulse 63  Ht 6\' 3"  (1.905 m)  Wt 189 lb 9.6 oz (86.002 kg)  BMI 23.70 kg/m2  Physical Exam:  Well appearing middle aged man, NAD HEENT: Unremarkable Neck:  No JVD, no thyromegally Lymphatics:  No adenopathy Back:  No CVA tenderness Lungs:  Clear with no wheezes, rales, or rhonchi. HEART:  Regular rate rhythm, no murmurs, no rubs, no clicks Abd:  soft, positive bowel sounds, no organomegally, no rebound, no guarding Ext:  2 plus pulses, no edema, no cyanosis, no clubbing Skin:  No rashes no nodules Neuro:  CN II through XII intact, motor grossly intact  EKG - normal sinus rhythm, QRS duration 98 ms.  Assess/Plan:

## 2014-08-12 NOTE — Assessment & Plan Note (Signed)
Etiology is unclear. He is pending MRI. He will followup with Dr. Leonie Man.

## 2014-08-15 ENCOUNTER — Ambulatory Visit (INDEPENDENT_AMBULATORY_CARE_PROVIDER_SITE_OTHER): Payer: BLUE CROSS/BLUE SHIELD

## 2014-08-15 DIAGNOSIS — H5347 Heteronymous bilateral field defects: Secondary | ICD-10-CM

## 2014-08-15 DIAGNOSIS — H539 Unspecified visual disturbance: Secondary | ICD-10-CM

## 2014-08-15 MED ORDER — GADOPENTETATE DIMEGLUMINE 469.01 MG/ML IV SOLN: 20.0000 mL | Freq: Once | INTRAVENOUS | Status: AC | PRN

## 2014-08-21 ENCOUNTER — Telehealth: Payer: Self-pay | Admitting: Neurology

## 2014-08-21 NOTE — Telephone Encounter (Signed)
I called the patient and gave him results of MRI scan of the brain, MRA of the brain and MRA of the neck all being normal. He was advised to continue scheduled follow-up with me next month or call earlier if needed. All his lab work also came back as normal.

## 2014-08-21 NOTE — Telephone Encounter (Signed)
Patient called requesting results from MRI. Please call and advise. Patient can be reached at [phone].

## 2014-10-10 ENCOUNTER — Ambulatory Visit: Payer: BLUE CROSS/BLUE SHIELD | Admitting: Neurology

## 2014-12-26 ENCOUNTER — Other Ambulatory Visit: Payer: Self-pay | Admitting: Internal Medicine

## 2015-02-14 ENCOUNTER — Other Ambulatory Visit: Payer: Self-pay | Admitting: Internal Medicine

## 2015-04-10 ENCOUNTER — Other Ambulatory Visit: Payer: Self-pay | Admitting: *Deleted

## 2015-04-10 MED ORDER — FLECAINIDE ACETATE 100 MG PO TABS: 100.0000 mg | ORAL_TABLET | Freq: Every day | ORAL | Status: DC

## 2015-04-10 MED ORDER — FLECAINIDE ACETATE 50 MG PO TABS: ORAL_TABLET | ORAL | Status: DC

## 2016-03-13 ENCOUNTER — Other Ambulatory Visit: Payer: Self-pay | Admitting: Internal Medicine

## 2016-03-18 ENCOUNTER — Other Ambulatory Visit: Payer: Self-pay | Admitting: Internal Medicine

## 2016-04-23 ENCOUNTER — Other Ambulatory Visit: Payer: Self-pay | Admitting: Internal Medicine

## 2016-05-04 ENCOUNTER — Telehealth: Payer: Self-pay | Admitting: *Deleted

## 2016-05-04 MED ORDER — FLECAINIDE ACETATE 100 MG PO TABS: 100.0000 mg | ORAL_TABLET | Freq: Every day | ORAL | 3 refills | Status: DC

## 2016-05-04 MED ORDER — FLECAINIDE ACETATE 50 MG PO TABS: ORAL_TABLET | ORAL | 3 refills | Status: DC

## 2016-05-04 NOTE — Telephone Encounter (Signed)
Spoke with Dr Lovena Le.  He approved sending in a 30 day supply for patient and schedule an office visit next available.  I have left a message for patient to contact Melissa to schedule

## 2016-05-14 ENCOUNTER — Ambulatory Visit (INDEPENDENT_AMBULATORY_CARE_PROVIDER_SITE_OTHER): Payer: 59 | Admitting: Internal Medicine

## 2016-05-14 ENCOUNTER — Encounter: Payer: Self-pay | Admitting: Internal Medicine

## 2016-05-14 VITALS — BP 102/68 | HR 68 | Ht 75.0 in | Wt 190.8 lb

## 2016-05-14 DIAGNOSIS — I480 Paroxysmal atrial fibrillation: Secondary | ICD-10-CM | POA: Diagnosis not present

## 2016-05-14 NOTE — Patient Instructions (Addendum)
Medication Instructions:  Your physician recommends that you continue on your current medications as directed. Please refer to the Current Medication list given to you today.   Labwork: Your physician recommends that you return for lab work in: 1 month for FASTING Lipids    Testing/Procedures: None ordered  Follow-Up: Your physician wants you to follow-up in: 1 year with Dr. Lovena Le. You will receive a reminder letter in the mail two months in advance. If you don't receive a letter, please call our office to schedule the follow-up appointment.   Any Other Special Instructions Will Be Listed Below (If Applicable).     If you need a refill on your cardiac medications before your next appointment, please call your pharmacy.  \

## 2016-05-14 NOTE — Progress Notes (Signed)
HPI Shawn Fields returns today for followup. He is a pleasant 56 yo man with a h/o PAF and sleep apnea. He has done well over the past few months and has been essentially free of atrial fibrillation until about 2 weeks ago. He notes that he has been under increased stress at work. He felt like his heart was out of rhythm and I asked him to take an additional 50 mg of flecained He is currently on full strength ASA. His CHADSVASC score is zero.   No Known Allergies   Current Outpatient Prescriptions  Medication Sig Dispense Refill  . aspirin EC 81 MG tablet Take 81 mg by mouth daily.    . flecainide (TAMBOCOR) 100 MG tablet Take 1 tablet (100 mg total) by mouth at bedtime. 90 tablet 3  . flecainide (TAMBOCOR) 50 MG tablet take 1 tablet by mouth daily AT LUNCH 90 tablet 3  . fluticasone (FLONASE) 50 MCG/ACT nasal spray Place 1 spray into the nose daily.     Marland Kitchen loratadine (CLARITIN) 10 MG tablet Take 10 mg by mouth daily.    . meclizine (ANTIVERT) 25 MG tablet Take 25 mg by mouth 3 (three) times daily as needed for dizziness or nausea.     . promethazine (PHENERGAN) 25 MG tablet Take 25 mg by mouth every 6 (six) hours as needed for nausea or vomiting.      No current facility-administered medications for this visit.      Past Medical History:  Diagnosis Date  . Allergic rhinitis   . Epididymal cyst    h/o   . H/O actinic keratosis    Treated sith Aldara periodically by Dr Ubaldo Glassing.  . H/O ganglion cyst    in the left wrist that resolved.  . Onychomycosis    treated w lamisil  . OSA (obstructive sleep apnea)    mild w AHI 14/hr now on 11cm H2O  . PAF (paroxysmal atrial fibrillation)    w CHADS VASC score 0-on ASA  . Sleep apnea     ROS:   All systems reviewed and negative except as noted in the HPI.   No past surgical history on file.   Family History  Problem Relation Age of Onset  . Parkinson's disease Mother   . Heart attack Father   . Heart disease Father   .  Arrhythmia Brother      Social History   Social History  . Marital status: Married    Spouse name: N/A  . Number of children: 2  . Years of education: 16   Occupational History  . [employer]     Social History Main Topics  . Smoking status: Never Smoker  . Smokeless tobacco: Never Used  . Alcohol use Yes     Comment: occasionally  . Drug use: No  . Sexual activity: Not on file   Other Topics Concern  . Not on file   Social History Narrative   Drinks 1 cup a caffeine a day         BP 102/68   Pulse 68   Ht 6\' 3"  (1.905 m)   Wt 190 lb 12.8 oz (86.5 kg)   SpO2 97%   BMI 23.85 kg/m   Physical Exam:  Well appearing middle aged man, NAD HEENT: Unremarkable Neck:  No JVD, no thyromegally Lymphatics:  No adenopathy Back:  No CVA tenderness Lungs:  Clear with no wheezes, rales, or rhonchi. HEART:  Regular rate rhythm, no  murmurs, no rubs, no clicks Abd:  soft, positive bowel sounds, no organomegally, no rebound, no guarding Ext:  2 plus pulses, no edema, no cyanosis, no clubbing Skin:  No rashes no nodules Neuro:  CN II through XII intact, motor grossly intact  EKG - normal sinus rhythm, QRS duration 98 ms.  Assess/Plan: 1. PAF - he is maintaining NSR very nicely. If his atrial fib increses then we will ask him to increase his flecainide to 100 bid. However, for now we will continue as is. I discussed atrial fib ablation but he does not have enough symptoms to justify this procedure at this time. 2. dylipidemia - I have asked that the patient undergo fasting lipids and a liver panel in the coming weeks. If they are abnormal, would consider a cardiac CT looking for calcium.

## 2016-06-11 ENCOUNTER — Other Ambulatory Visit: Payer: 59 | Admitting: *Deleted

## 2016-06-11 DIAGNOSIS — I480 Paroxysmal atrial fibrillation: Secondary | ICD-10-CM

## 2016-06-11 LAB — LIPID PANEL
CHOL/HDL RATIO: 3.4 ratio (ref 0.0–5.0)
Cholesterol, Total: 164 mg/dL (ref 100–199)
HDL: 48 mg/dL (ref >39)
LDL CALC: 94 mg/dL (ref 0–99)
Triglycerides: 110 mg/dL (ref 0–149)
VLDL CHOLESTEROL CAL: 22 mg/dL (ref 5–40)

## 2016-06-15 ENCOUNTER — Other Ambulatory Visit: Payer: 59

## 2016-07-27 ENCOUNTER — Telehealth: Payer: Self-pay | Admitting: Internal Medicine

## 2016-07-27 ENCOUNTER — Other Ambulatory Visit (HOSPITAL_COMMUNITY): Payer: Self-pay | Admitting: *Deleted

## 2016-07-27 ENCOUNTER — Ambulatory Visit (HOSPITAL_COMMUNITY): Payer: 59 | Attending: Internal Medicine

## 2016-07-27 DIAGNOSIS — R0609 Other forms of dyspnea: Secondary | ICD-10-CM | POA: Diagnosis present

## 2016-07-27 DIAGNOSIS — R0600 Dyspnea, unspecified: Secondary | ICD-10-CM | POA: Diagnosis not present

## 2016-07-27 NOTE — Telephone Encounter (Signed)
Spoke to Shawn Fields last week regarding symptoms he was having of dyspnea with exertion. No obvious chest pain. I have recommended he undergo cardiopulmonary stress testing for unexplained dyspnea in the setting of PAF on flecainide.

## 2016-08-05 ENCOUNTER — Telehealth: Payer: Self-pay

## 2016-08-05 NOTE — Telephone Encounter (Signed)
Call placed to Pt to discuss test results.  Left VM requesting call back.  Will confirm Pt received results.

## 2016-08-05 NOTE — Telephone Encounter (Signed)
Received call back from [facility], Materials engineer.  Per relayed message, Pt has spoke with Dr. Lovena Le regarding results of stress test.  No further action required.

## 2016-11-30 ENCOUNTER — Telehealth: Payer: Self-pay | Admitting: *Deleted

## 2016-11-30 NOTE — Telephone Encounter (Signed)
Patient agrees to be seen with Dr Radford Pax for re-evaluation of CPAP on Monday February 22 2017.

## 2016-11-30 NOTE — Telephone Encounter (Signed)
-----   Message from Sueanne Margarita, MD sent at 11/28/2016  3:31 PM EST ----- Regarding: RE: CPAP He needs to be seen before getting new machine  Traci ----- Message ----- From: Freada Bergeron, CMA Sent: 11/27/2016   1:03 PM To: Sueanne Margarita, MD Subject: CPAP                                           Hello Dr Radford Pax, this patient has not been seen in office since 11/14/2013. Patient needs a replacement CPAP. Should I get a download and make an office visit. Please advise Thanks, Gae Bon

## 2016-12-07 ENCOUNTER — Encounter: Payer: Self-pay | Admitting: Cardiology

## 2016-12-24 ENCOUNTER — Telehealth: Payer: Self-pay | Admitting: *Deleted

## 2016-12-24 NOTE — Telephone Encounter (Signed)
-----   Message from Sueanne Margarita, MD sent at 12/23/2016  8:40 AM EST ----- Good AHI and compliance.  Continue current CPAP settings.

## 2016-12-24 NOTE — Telephone Encounter (Signed)
Informed patient of compliance  results and verbalized understanding was indicated. Patient understands his apnea events are in normal range at 4.3. Patient understands his compliance is good. Patient understands his current settings will not change. Patient thanked me for the call.

## 2017-02-22 ENCOUNTER — Ambulatory Visit (INDEPENDENT_AMBULATORY_CARE_PROVIDER_SITE_OTHER): Payer: 59 | Admitting: Cardiology

## 2017-02-22 ENCOUNTER — Encounter: Payer: Self-pay | Admitting: Cardiology

## 2017-02-22 ENCOUNTER — Telehealth: Payer: Self-pay | Admitting: *Deleted

## 2017-02-22 ENCOUNTER — Encounter (INDEPENDENT_AMBULATORY_CARE_PROVIDER_SITE_OTHER): Payer: Self-pay

## 2017-02-22 VITALS — BP 114/71 | HR 71 | Ht 75.0 in | Wt 184.8 lb

## 2017-02-22 DIAGNOSIS — G4733 Obstructive sleep apnea (adult) (pediatric): Secondary | ICD-10-CM

## 2017-02-22 NOTE — Telephone Encounter (Signed)
Order sent to [facility] via community message 

## 2017-02-22 NOTE — Patient Instructions (Signed)
Medication Instructions:  Your physician recommends that you continue on your current medications as directed. Please refer to the Current Medication list given to you today.  Labwork: None Ordered   Testing/Procedures: None Ordered   Follow-Up: Your physician recommends that you schedule a follow-up appointment in: 10 weeks with Dr. Radford Pax   Any Other Special Instructions Will Be Listed Below (If Applicable).  CPAP orders have been placed. You will receive a call from the home health agency regarding setting up equipment. If you do not receive a call within the next week give Gae Bon, CPAP assistant a call at [phone].   Thank you for choosing [facility], RN  [phone]    If you need a refill on your cardiac medications before your next appointment, please call your pharmacy.

## 2017-02-22 NOTE — Progress Notes (Signed)
Cardiology Office Note:    Date:  02/22/2017   ID:  Shawn Fields, DOB [birth year], MRN [id number]  PCP:  Antony Contras, MD  Cardiologist:  No primary care provider on file.    Referring MD: Antony Contras, MD   Chief Complaint  Patient presents with  . Sleep Apnea    History of Present Illness:    Shawn Fields is a 57 y.o. male with a hx of OSA on CPAP and PAF followed by EP.  He is doing well with his CPAP device.  He tolerates the nasal pillow mask with chin strap and feels the pressure is adequate.  Since going on CPAP he feels rested in the am and has no significant daytime sleepiness.  He denies any significant mouth or nasal dryness or nasal congestion.  He does not think that he snores.     Past Medical History:  Diagnosis Date  . Allergic rhinitis   . Epididymal cyst    h/o   . H/O actinic keratosis    Treated sith Aldara periodically by Dr Ubaldo Glassing.  . H/O ganglion cyst    in the left wrist that resolved.  . Onychomycosis    treated w lamisil  . OSA (obstructive sleep apnea)    mild w AHI 14/hr now on 11cm H2O  . PAF (paroxysmal atrial fibrillation) (HCC)    w CHADS VASC score 0-on ASA  . Sleep apnea     No past surgical history on file.  Current Medications: No outpatient medications have been marked as taking for the 02/22/17 encounter (Office Visit) with Sueanne Margarita, MD.     Allergies:   Patient has no known allergies.   Social History   Socioeconomic History  . Marital status: Married    Spouse name: Not on file  . Number of children: 2  . Years of education: 52  . Highest education level: Not on file  Social Needs  . Financial resource strain: Not on file  . Food insecurity - worry: Not on file  . Food insecurity - inability: Not on file  . Transportation needs - medical: Not on file  . Transportation needs - non-medical: Not on file  Occupational History  . Occupation: [employer]   Tobacco Use  . Smoking status: Never Smoker  .  Smokeless tobacco: Never Used  Substance and Sexual Activity  . Alcohol use: Yes    Comment: occasionally  . Drug use: No  . Sexual activity: Not on file  Other Topics Concern  . Not on file  Social History Narrative   Drinks 1 cup a caffeine a day      Family History: The patient's family history includes Arrhythmia in his brother; Heart attack in his father; Heart disease in his father; Parkinson's disease in his mother.  ROS:   Please see the history of present illness.    ROS  All other systems reviewed and negative.   EKGs/Labs/Other Studies Reviewed:    The following studies were reviewed today: CPAP download  EKG:  EKG is not ordered today.   Recent Labs: No results found for requested labs within last 8760 hours.   Recent Lipid Panel    Component Value Date/Time   CHOL 164 06/11/2016 0907   TRIG 110 06/11/2016 0907   HDL 48 06/11/2016 0907   CHOLHDL 3.4 06/11/2016 0907   LDLCALC 94 06/11/2016 0907    Physical Exam:    VS:  There were no vitals taken for  this visit.    Wt Readings from Last 3 Encounters:  05/14/16 190 lb 12.8 oz (86.5 kg)  08/14/14 189 lb (85.7 kg)  08/14/14 189 lb (85.7 kg)     GEN:  Well nourished, well developed in no acute distress HEENT: Normal NECK: No JVD; No carotid bruits LYMPHATICS: No lymphadenopathy CARDIAC: RRR, no murmurs, rubs, gallops RESPIRATORY:  Clear to auscultation without rales, wheezing or rhonchi  ABDOMEN: Soft, non-tender, non-distended MUSCULOSKELETAL:  No edema; No deformity  SKIN: Warm and dry NEUROLOGIC:  Alert and oriented x 3 PSYCHIATRIC:  Normal affect   ASSESSMENT:    1. OSA (obstructive sleep apnea)    PLAN:    In order of problems listed above:  1.  OSA - the patient is tolerating PAP therapy well without any problems. The PAP download was reviewed today from 10/2016-11/2016 showed an AHI of 4.3/hr on 11 cm H2O with 100% compliance in using more than 4 hours nightly.  Today's download  was  reviewed today and showed an AHI of 3.6/hr on 11 cm H2O with 97% compliance in using more than 4 hours nightly.  The patient has been using and benefiting from PAP use and will continue to benefit from therapy.    Medication Adjustments/Labs and Tests Ordered: Current medicines are reviewed at length with the patient today.  Concerns regarding medicines are outlined above.  No orders of the defined types were placed in this encounter.  No orders of the defined types were placed in this encounter.   Signed, Fransico Him, MD  02/22/2017 9:22 AM    [place]

## 2017-03-04 DIAGNOSIS — G4733 Obstructive sleep apnea (adult) (pediatric): Secondary | ICD-10-CM | POA: Diagnosis not present

## 2017-03-07 DIAGNOSIS — R1032 Left lower quadrant pain: Secondary | ICD-10-CM | POA: Diagnosis not present

## 2017-03-11 ENCOUNTER — Telehealth: Payer: Self-pay | Admitting: *Deleted

## 2017-03-11 NOTE — Telephone Encounter (Signed)
Patient has a 10 week follow up appointment scheduled for  May 11 2017. Patient understands he needs to keep this appointment for insurance compliance. Patient was grateful for the call and thanked me.

## 2017-03-22 ENCOUNTER — Other Ambulatory Visit: Payer: Self-pay | Admitting: Internal Medicine

## 2017-03-22 ENCOUNTER — Encounter (HOSPITAL_COMMUNITY): Payer: Self-pay | Admitting: Nurse Practitioner

## 2017-03-22 ENCOUNTER — Ambulatory Visit (HOSPITAL_COMMUNITY)
Admission: RE | Admit: 2017-03-22 | Discharge: 2017-03-22 | Disposition: A | Discharge status: Home / Self Care | Payer: 59 | Source: Ambulatory Visit | Attending: Nurse Practitioner | Admitting: Nurse Practitioner

## 2017-03-22 VITALS — BP 110/74 | HR 72 | Ht 75.0 in | Wt 180.0 lb

## 2017-03-22 DIAGNOSIS — I480 Paroxysmal atrial fibrillation: Secondary | ICD-10-CM | POA: Diagnosis not present

## 2017-03-22 DIAGNOSIS — Z7982 Long term (current) use of aspirin: Secondary | ICD-10-CM | POA: Diagnosis not present

## 2017-03-22 DIAGNOSIS — G4733 Obstructive sleep apnea (adult) (pediatric): Secondary | ICD-10-CM | POA: Diagnosis not present

## 2017-03-22 DIAGNOSIS — Z79899 Other long term (current) drug therapy: Secondary | ICD-10-CM | POA: Insufficient documentation

## 2017-03-22 DIAGNOSIS — I4891 Unspecified atrial fibrillation: Secondary | ICD-10-CM | POA: Diagnosis present

## 2017-03-22 MED ORDER — DILTIAZEM HCL 30 MG PO TABS: ORAL_TABLET | ORAL | 0 refills | Status: DC

## 2017-03-22 MED ORDER — FLECAINIDE ACETATE 100 MG PO TABS: 100.0000 mg | ORAL_TABLET | Freq: Two times a day (BID) | ORAL | Status: DC

## 2017-03-22 NOTE — Patient Instructions (Signed)
Increase flecainide to 100mg  twice a day Cardizem 30mg  -- take 1 tablet every 4 hours AS NEEDED for AFIB heart rate >100 as long as top number of blood pressure >100.

## 2017-03-23 ENCOUNTER — Other Ambulatory Visit (HOSPITAL_COMMUNITY): Payer: Self-pay | Admitting: *Deleted

## 2017-03-23 MED ORDER — FLECAINIDE ACETATE 100 MG PO TABS: 100.0000 mg | ORAL_TABLET | Freq: Two times a day (BID) | ORAL | 3 refills | Status: DC

## 2017-03-23 NOTE — Progress Notes (Signed)
Primary Care Physician: Antony Contras, MD Referring Physician: Dr.  Desmond Lope Shawn Fields is a 57 y.o. male with a h/o paroxysmal afib that is in the afib clinic for a recent increase in afib burden. He is currently taking 100 mg of flecainide at lunch and 50 mg at hs(around 11:30 pm) He was on daily BB/CCB at one time but these drugs were stopped 2/2 hypotension. He recently went out of town and he and his wife drank more alcohol that they usually do. He tracks his HR by his watch as he can not always feel it.  Today, he denies symptoms of palpitations, chest pain, shortness of breath, orthopnea, PND, lower extremity edema, dizziness, presyncope, syncope, or neurologic sequela. The patient is tolerating medications without difficulties and is otherwise without complaint today.   Past Medical History:  Diagnosis Date  . Allergic rhinitis   . Epididymal cyst    h/o   . H/O actinic keratosis    Treated sith Aldara periodically by Dr Ubaldo Glassing.  . H/O ganglion cyst    in the left wrist that resolved.  . Onychomycosis    treated w lamisil  . OSA (obstructive sleep apnea)    mild w AHI 14/hr now on 11cm H2O  . PAF (paroxysmal atrial fibrillation) (HCC)    w CHADS VASC score 0-on ASA  . Sleep apnea    No past surgical history on file.  Current Outpatient Medications  Medication Sig Dispense Refill  . aspirin EC 81 MG tablet Take 81 mg by mouth daily.    . flecainide (TAMBOCOR) 100 MG tablet Take 1 tablet (100 mg total) by mouth 2 (two) times daily. take 1 tablet by mouth daily AT LUNCH    . NON FORMULARY CPAP    . diltiazem (CARDIZEM) 30 MG tablet Cardizem 30mg  -- take 1 tablet every 4 hours AS NEEDED for AFIB heart rate >100 45 tablet 0  . meclizine (ANTIVERT) 25 MG tablet Take 25 mg by mouth 3 (three) times daily as needed for dizziness or nausea.     . promethazine (PHENERGAN) 25 MG tablet Take 25 mg by mouth every 6 (six) hours as needed for nausea or vomiting.      No current  facility-administered medications for this encounter.     No Known Allergies  Social History   Socioeconomic History  . Marital status: Married    Spouse name: Not on file  . Number of children: 2  . Years of education: 54  . Highest education level: Not on file  Social Needs  . Financial resource strain: Not on file  . Food insecurity - worry: Not on file  . Food insecurity - inability: Not on file  . Transportation needs - medical: Not on file  . Transportation needs - non-medical: Not on file  Occupational History  . Occupation: [employer]   Tobacco Use  . Smoking status: Never Smoker  . Smokeless tobacco: Never Used  Substance and Sexual Activity  . Alcohol use: Yes    Comment: occasionally  . Drug use: No  . Sexual activity: Not on file  Other Topics Concern  . Not on file  Social History Narrative   Drinks 1 cup a caffeine a day     Family History  Problem Relation Age of Onset  . Parkinson's disease Mother   . Heart attack Father   . Heart disease Father   . Arrhythmia Brother     ROS- All systems  are reviewed and negative except as per the HPI above  Physical Exam: Vitals:   03/22/17 1520  BP: 110/74  Pulse: 72  Weight: 180 lb (81.6 kg)  Height: 6\' 3"  (1.905 m)   Wt Readings from Last 3 Encounters:  03/22/17 180 lb (81.6 kg)  02/22/17 184 lb 12.8 oz (83.8 kg)  05/14/16 190 lb 12.8 oz (86.5 kg)    Labs: Lab Results  Component Value Date   NA 140 12/16/2007   K 5.1 SLIGHT HEMOLYSIS 12/16/2007   CL 104 12/16/2007   CO2 28 12/16/2007   GLUCOSE 79 12/16/2007   BUN 18 12/16/2007   CREATININE 1.0 12/16/2007   CALCIUM 9.4 12/16/2007   No results found for: INR Lab Results  Component Value Date   CHOL 164 06/11/2016   HDL 48 06/11/2016   LDLCALC 94 06/11/2016   TRIG 110 06/11/2016     GEN- The patient is well appearing, alert and oriented x 3 today.   Head- normocephalic, atraumatic Eyes-  Sclera clear, conjunctiva pink Ears-  hearing intact Oropharynx- clear Neck- supple, no JVP Lymph- no cervical lymphadenopathy Lungs- Clear to ausculation bilaterally, normal work of breathing Heart- Regular rate and rhythm, no murmurs, rubs or gallops, PMI not laterally displaced GI- soft, NT, ND, + BS Extremities- no clubbing, cyanosis, or edema MS- no significant deformity or atrophy Skin- no rash or lesion Psych- euthymic mood, full affect Neuro- strength and sensation are intact  EKG-NSR at 72 bpm, pr int 172 ms, qrs int 106 ms, qtc 440 ms Epic records reviewed   Assessment and Plan: 1. Paroxysmal afib  Discussed with pt options For now he will increase flecainide to 100 mg bid I will Rx Cardizem 30 mg tab to use as needed for prolonged elevated HR He does not want to go on low dose rate control at this point Triggers discussed, possibly increased alcohol intake may have the cause of recent episodes  He currently has a chadsvasc score of 0 and will continue off anticoagulation I don't think burden justifies ablation at this point   He will f/u with Dr. Lovena Le as needed  [name], [facility] [address] [place], [place] [address] [phone]

## 2017-03-25 ENCOUNTER — Ambulatory Visit (HOSPITAL_COMMUNITY)
Admission: RE | Admit: 2017-03-25 | Discharge: 2017-03-25 | Disposition: A | Discharge status: Home / Self Care | Payer: 59 | Source: Ambulatory Visit | Attending: Nurse Practitioner | Admitting: Nurse Practitioner

## 2017-03-25 ENCOUNTER — Encounter (HOSPITAL_COMMUNITY): Payer: Self-pay | Admitting: Nurse Practitioner

## 2017-03-25 VITALS — BP 118/74 | HR 81 | Ht 75.0 in | Wt 178.6 lb

## 2017-03-25 DIAGNOSIS — Z820 Family history of epilepsy and other diseases of the nervous system: Secondary | ICD-10-CM | POA: Insufficient documentation

## 2017-03-25 DIAGNOSIS — Z8249 Family history of ischemic heart disease and other diseases of the circulatory system: Secondary | ICD-10-CM | POA: Insufficient documentation

## 2017-03-25 DIAGNOSIS — Z7982 Long term (current) use of aspirin: Secondary | ICD-10-CM | POA: Diagnosis not present

## 2017-03-25 DIAGNOSIS — Z79899 Other long term (current) drug therapy: Secondary | ICD-10-CM | POA: Insufficient documentation

## 2017-03-25 DIAGNOSIS — I480 Paroxysmal atrial fibrillation: Secondary | ICD-10-CM | POA: Insufficient documentation

## 2017-03-25 DIAGNOSIS — G4733 Obstructive sleep apnea (adult) (pediatric): Secondary | ICD-10-CM | POA: Insufficient documentation

## 2017-03-25 NOTE — Progress Notes (Signed)
Primary Care Physician: Antony Contras, MD Referring Physician: Dr.  Desmond Lope Shawn Fields is a 57 y.o. male with a h/o paroxysmal afib that is in the afib clinic for a recent increase in afib burden. He is currently taking 100 mg of flecainide at lunch and 50 mg at hs(around 11:30 pm) He was on daily BB/CCB at one time but these drugs were stopped 2/2 hypotension. He recently went out of town and he and his wife drank more alcohol that they usually do. He tracks his HR by his watch as he can not always feel it. It was suggested that he increase flecainide to 100 mg bid and 30 mg Cardizem was given.  F/u in afib clinic, EKG stable with increase in flecainide to 100 mg bid. He has not been aware of any sustained arrhythmia's but notices on his apple watch as least daily that his HR will hit 206 bpm, non sutained,, but he is not symptomatic with this and would not be aware if he did not notice on his apple watch. .  Today, he denies symptoms of palpitations, chest pain, shortness of breath, orthopnea, PND, lower extremity edema, dizziness, presyncope, syncope, or neurologic sequela. The patient is tolerating medications without difficulties and is otherwise without complaint today.   Past Medical History:  Diagnosis Date  . Allergic rhinitis   . Epididymal cyst    h/o   . H/O actinic keratosis    Treated sith Aldara periodically by Dr Ubaldo Glassing.  . H/O ganglion cyst    in the left wrist that resolved.  . Onychomycosis    treated w lamisil  . OSA (obstructive sleep apnea)    mild w AHI 14/hr now on 11cm H2O  . PAF (paroxysmal atrial fibrillation) (HCC)    w CHADS VASC score 0-on ASA  . Sleep apnea    No past surgical history on file.  Current Outpatient Medications  Medication Sig Dispense Refill  . aspirin EC 81 MG tablet Take 81 mg by mouth daily.    Marland Kitchen diltiazem (CARDIZEM) 30 MG tablet Cardizem 30mg  -- take 1 tablet every 4 hours AS NEEDED for AFIB heart rate >100 45 tablet 0  .  flecainide (TAMBOCOR) 100 MG tablet Take 1 tablet (100 mg total) by mouth 2 (two) times daily. 60 tablet 3  . meclizine (ANTIVERT) 25 MG tablet Take 25 mg by mouth 3 (three) times daily as needed for dizziness or nausea.     . NON FORMULARY CPAP    . promethazine (PHENERGAN) 25 MG tablet Take 25 mg by mouth every 6 (six) hours as needed for nausea or vomiting.      No current facility-administered medications for this encounter.     No Known Allergies  Social History   Socioeconomic History  . Marital status: Married    Spouse name: Not on file  . Number of children: 2  . Years of education: 37  . Highest education level: Not on file  Social Needs  . Financial resource strain: Not on file  . Food insecurity - worry: Not on file  . Food insecurity - inability: Not on file  . Transportation needs - medical: Not on file  . Transportation needs - non-medical: Not on file  Occupational History  . Occupation: [employer]   Tobacco Use  . Smoking status: Never Smoker  . Smokeless tobacco: Never Used  Substance and Sexual Activity  . Alcohol use: Yes    Comment: occasionally  .  Drug use: No  . Sexual activity: Not on file  Other Topics Concern  . Not on file  Social History Narrative   Drinks 1 cup a caffeine a day     Family History  Problem Relation Age of Onset  . Parkinson's disease Mother   . Heart attack Father   . Heart disease Father   . Arrhythmia Brother     ROS- All systems are reviewed and negative except as per the HPI above  Physical Exam: Vitals:   03/25/17 0916  BP: 118/74  Pulse: 81  Weight: 178 lb 9.6 oz (81 kg)  Height: 6\' 3"  (1.905 m)   Wt Readings from Last 3 Encounters:  03/25/17 178 lb 9.6 oz (81 kg)  03/22/17 180 lb (81.6 kg)  02/22/17 184 lb 12.8 oz (83.8 kg)    Labs: Lab Results  Component Value Date   NA 140 12/16/2007   K 5.1 SLIGHT HEMOLYSIS 12/16/2007   CL 104 12/16/2007   CO2 28 12/16/2007   GLUCOSE 79 12/16/2007    BUN 18 12/16/2007   CREATININE 1.0 12/16/2007   CALCIUM 9.4 12/16/2007   No results found for: INR Lab Results  Component Value Date   CHOL 164 06/11/2016   HDL 48 06/11/2016   LDLCALC 94 06/11/2016   TRIG 110 06/11/2016     GEN- The patient is well appearing, alert and oriented x 3 today.   Head- normocephalic, atraumatic Eyes-  Sclera clear, conjunctiva pink Ears- hearing intact Oropharynx- clear Neck- supple, no JVP Lymph- no cervical lymphadenopathy Lungs- Clear to ausculation bilaterally, normal work of breathing Heart- Regular rate and rhythm, no murmurs, rubs or gallops, PMI not laterally displaced GI- soft, NT, ND, + BS Extremities- no clubbing, cyanosis, or edema MS- no significant deformity or atrophy Skin- no rash or lesion Psych- euthymic mood, full affect Neuro- strength and sensation are intact  EKG-NSR at 81 bpm, Pr int 172 ms, qrs int 96 ms, qtc 429 ms Epic records reviewed   Assessment and Plan: 1. Paroxysmal afib  Continue flecainide 100 mg bid He has Cardizem 30 mg tab to use as needed for prolonged elevated HR Suggested 48 hour monitor as he sees increase in HR every day to 200 bpm, by apple watch,but he defers at this point He does not want to go on low dose rate control at this point Triggers discussed, possibly increased alcohol intake may have the cause of recent episodes, is avoiding alcohol He currently has a chadsvasc score of 0 and will continue off anticoagulation I don't think burden justifies ablation at this point   He will f/u with Dr. Lovena Le within   the next 1-2  weeks  Butch Penny C. Ura Yingling, [facility] [address] [place], [place] [address] [phone]

## 2017-04-01 DIAGNOSIS — G4733 Obstructive sleep apnea (adult) (pediatric): Secondary | ICD-10-CM | POA: Diagnosis not present

## 2017-04-09 ENCOUNTER — Encounter: Payer: Self-pay | Admitting: Internal Medicine

## 2017-04-09 ENCOUNTER — Ambulatory Visit (INDEPENDENT_AMBULATORY_CARE_PROVIDER_SITE_OTHER): Payer: 59 | Admitting: Internal Medicine

## 2017-04-09 VITALS — BP 104/70 | HR 94 | Ht 75.0 in | Wt 175.0 lb

## 2017-04-09 DIAGNOSIS — I480 Paroxysmal atrial fibrillation: Secondary | ICD-10-CM

## 2017-04-09 MED ORDER — FLECAINIDE ACETATE 100 MG PO TABS: 100.0000 mg | ORAL_TABLET | Freq: Two times a day (BID) | ORAL | 3 refills | Status: DC

## 2017-04-09 NOTE — Progress Notes (Signed)
HPI [name] returns today for followup.  He has a history of paroxysmal atrial fibrillation, and has been maintained in sinus rhythm fairly nicely on flecainide.  Over the last several weeks, he has had an increase in his episodes of atrial fibrillation.  No syncope.  He has palpitations.  He has a heart monitor on his watch and he has had heart rates of up to 170 at times.  Despite this, he is not particularly symptomatic.  He has continued to have a heaviness sensation in his chest which is not related to exertion, and has been worked up previously.  He admits to some dietary indiscretion and does admit to consuming more alcohol recently.  He has recently considered upgrading to an apple watch 4.  No Known Allergies   Current Outpatient Medications  Medication Sig Dispense Refill  . aspirin EC 81 MG tablet Take 81 mg by mouth daily.    Marland Kitchen diltiazem (CARDIZEM) 30 MG tablet Cardizem 30mg  -- take 1 tablet every 4 hours AS NEEDED for AFIB heart rate >100 45 tablet 0  . flecainide (TAMBOCOR) 100 MG tablet Take 1 tablet (100 mg total) by mouth 2 (two) times daily. 180 tablet 3  . NON FORMULARY CPAP     No current facility-administered medications for this visit.      Past Medical History:  Diagnosis Date  . Allergic rhinitis   . Epididymal cyst    h/o   . H/O actinic keratosis    Treated sith Aldara periodically by Dr Ubaldo Glassing.  . H/O ganglion cyst    in the left wrist that resolved.  . Onychomycosis    treated w lamisil  . OSA (obstructive sleep apnea)    mild w AHI 14/hr now on 11cm H2O  . PAF (paroxysmal atrial fibrillation) (HCC)    w CHADS VASC score 0-on ASA  . Sleep apnea     ROS:   All systems reviewed and negative except as noted in the HPI.   History reviewed. No pertinent surgical history.   Family History  Problem Relation Age of Onset  . Parkinson's disease Mother   . Heart attack Father   . Heart disease Father   . Arrhythmia Brother      Social  History   Socioeconomic History  . Marital status: Married    Spouse name: Not on file  . Number of children: 2  . Years of education: 32  . Highest education level: Not on file  Occupational History  . Occupation: [employer]  . Financial resource strain: Not on file  . Food insecurity:    Worry: Not on file    Inability: Not on file  . Transportation needs:    Medical: Not on file    Non-medical: Not on file  Tobacco Use  . Smoking status: Never Smoker  . Smokeless tobacco: Never Used  Substance and Sexual Activity  . Alcohol use: Yes    Comment: occasionally  . Drug use: No  . Sexual activity: Not on file  Lifestyle  . Physical activity:    Days per week: Not on file    Minutes per session: Not on file  . Stress: Not on file  Relationships  . Social connections:    Talks on phone: Not on file    Gets together: Not on file    Attends religious service: Not on file    Active member of club or organization: Not  on file    Attends meetings of clubs or organizations: Not on file    Relationship status: Not on file  . Intimate partner violence:    Fear of current or ex partner: Not on file    Emotionally abused: Not on file    Physically abused: Not on file    Forced sexual activity: Not on file  Other Topics Concern  . Not on file  Social History Narrative   Drinks 1 cup a caffeine a day      BP 104/70   Pulse 94   Ht 6\' 3"  (1.905 m)   Wt 175 lb (79.4 kg)   SpO2 97%   BMI 21.87 kg/m   Physical Exam:  Well appearing 57 year old man, NAD HEENT: Unremarkable Neck: 6 cm JVD, no thyromegally Lymphatics:  No adenopathy Back:  No CVA tenderness Lungs:  Clear, with no wheezes, rales, or rhonchi HEART:  Regular rate rhythm, no murmurs, no rubs, no clicks Abd:  soft, positive bowel sounds, no organomegally, no rebound, no guarding Ext:  2 plus pulses, no edema, no cyanosis, no clubbing Skin:  No rashes no nodules Neuro:  CN II through XII  intact, motor grossly intact  EKG -normal sinus rhythm  Assess/Plan:  1.  Paroxysmal atrial fibrillation -he will continue flecainide 100 mg twice a day.  With regard to monitoring, he plans to purchase an apple watch to better understand how much atrial fibrillation he is actually having.  We discussed catheter ablation in some detail.  His stroke risk is still quite low as he does not have hypertension or other stroke risk factors and is 57 years of age.  I encouraged him to reduce his alcohol consumption. 20 minutes was spent producing this note and seen the patient.  Crissie Sickles, MD

## 2017-04-09 NOTE — Patient Instructions (Addendum)
Medication Instructions:  Your physician recommends that you continue on your current medications as directed. Please refer to the Current Medication list given to you today.  Labwork: None ordered.  Testing/Procedures: None ordered.  Follow-Up: Your physician wants you to follow-up in: 3 months with Dr. Taylor.      Any Other Special Instructions Will Be Listed Below (If Applicable).  If you need a refill on your cardiac medications before your next appointment, please call your pharmacy.   

## 2017-04-11 ENCOUNTER — Encounter: Payer: Self-pay | Admitting: Internal Medicine

## 2017-04-19 DIAGNOSIS — R251 Tremor, unspecified: Secondary | ICD-10-CM | POA: Diagnosis not present

## 2017-04-19 DIAGNOSIS — K4090 Unilateral inguinal hernia, without obstruction or gangrene, not specified as recurrent: Secondary | ICD-10-CM | POA: Diagnosis not present

## 2017-04-21 ENCOUNTER — Telehealth: Payer: Self-pay | Admitting: *Deleted

## 2017-04-21 NOTE — Telephone Encounter (Signed)
Patient is aware and agreeable to AHI being within range at 4.3. Patient is aware and agreeable to being in compliance with machine usage Patient is aware and agreeable to no change in current pressures. Confirmed appointment for April 30 at 9:20.

## 2017-04-21 NOTE — Telephone Encounter (Signed)
-----   Message from Sueanne Margarita, MD sent at 04/14/2017  5:34 PM EDT ----- Good AHI and compliance.  Continue current PAP settings.

## 2017-05-02 DIAGNOSIS — G4733 Obstructive sleep apnea (adult) (pediatric): Secondary | ICD-10-CM | POA: Diagnosis not present

## 2017-05-07 DIAGNOSIS — K4090 Unilateral inguinal hernia, without obstruction or gangrene, not specified as recurrent: Secondary | ICD-10-CM | POA: Diagnosis not present

## 2017-05-07 DIAGNOSIS — N401 Enlarged prostate with lower urinary tract symptoms: Secondary | ICD-10-CM | POA: Diagnosis not present

## 2017-05-11 ENCOUNTER — Ambulatory Visit (INDEPENDENT_AMBULATORY_CARE_PROVIDER_SITE_OTHER): Payer: 59 | Admitting: Cardiology

## 2017-05-11 ENCOUNTER — Encounter: Payer: Self-pay | Admitting: Cardiology

## 2017-05-11 ENCOUNTER — Telehealth: Payer: Self-pay | Admitting: *Deleted

## 2017-05-11 VITALS — BP 108/68 | HR 76 | Ht 75.0 in | Wt 178.2 lb

## 2017-05-11 DIAGNOSIS — G4733 Obstructive sleep apnea (adult) (pediatric): Secondary | ICD-10-CM

## 2017-05-11 NOTE — Telephone Encounter (Signed)
  Robertson, Donnisha, RN  Dominyck Reser G, CMA        dme order placed   Thanks  Rena    

## 2017-05-11 NOTE — Telephone Encounter (Signed)
Per Dr Radford Pax,  I have recommending doing a 2-week auto titration from 4 to 12 cm water pressure to see if we can lower the pressure any.  Order has been faxed to [facility] at [phone]. Pt is aware and agreeable to treatment.

## 2017-05-11 NOTE — Patient Instructions (Signed)
Medication Instructions:  Your physician recommends that you continue on your current medications as directed. Please refer to the Current Medication list given to you today.  If you need a refill on your cardiac medications, please contact your pharmacy first.  Labwork: None ordered   Testing/Procedures: None ordered   Follow-Up: Your physician wants you to follow-up in: 1 year with Dr. Turner. You will receive a reminder letter in the mail two months in advance. If you don't receive a letter, please call our office to schedule the follow-up appointment.  Any Other Special Instructions Will Be Listed Below (If Applicable).   Thank you for choosing [facility]    Rena Evangela Heffler, RN  [phone]  If you need a refill on your cardiac medications before your next appointment, please call your pharmacy.   

## 2017-05-11 NOTE — Progress Notes (Signed)
Cardiology Office Note:    Date:  05/11/2017   ID:  Shawn Fields, DOB 05-21-1960, MRN [id number]  PCP:  Antony Contras, MD  Cardiologist:  No primary care provider on file.    Referring MD: Antony Contras, MD   Chief Complaint  Patient presents with  . Sleep Apnea    History of Present Illness:    Shawn Fields is a 57 y.o. male with a hx of OSA with an AHI of 14/h now on CPAP 11 cm water pressure.  He recently requested a new CPAP machine and he is now back for follow-up per insurance requirements to document compliance.  He is doing well with his CPAP device.  He tolerates the nasal pillow mask but feels the pressure is higher than it was before despite still being on 11 cm water pressure.  He says that during the night the pressure will be so high that it starts to blow his nasal pillows out of his nose and he has not been sleeping as well because of this.  He has noticed that he does not feel as rested in the morning as he used to.  He goes to bed around 1130 or 12 but sometimes gets up at 6:30 in the morning.  He denies any significant mouth or nasal dryness or nasal congestion.    Past Medical History:  Diagnosis Date  . Allergic rhinitis   . Epididymal cyst    h/o   . H/O actinic keratosis    Treated sith Aldara periodically by Dr Ubaldo Glassing.  . H/O ganglion cyst    in the left wrist that resolved.  . Onychomycosis    treated w lamisil  . OSA (obstructive sleep apnea)    mild w AHI 14/hr now on 11cm H2O  . PAF (paroxysmal atrial fibrillation) (HCC)    w CHADS VASC score 0-on ASA    History reviewed. No pertinent surgical history.  Current Medications: Current Meds  Medication Sig  . aspirin EC 81 MG tablet Take 81 mg by mouth daily.  Marland Kitchen diltiazem (CARDIZEM) 30 MG tablet Cardizem 30mg  -- take 1 tablet every 4 hours AS NEEDED for AFIB heart rate >100  . flecainide (TAMBOCOR) 100 MG tablet Take 1 tablet (100 mg total) by mouth 2 (two) times daily.  . NON FORMULARY CPAP     Allergies:   Patient has no known allergies.   Social History   Socioeconomic History  . Marital status: Married    Spouse name: Not on file  . Number of children: 2  . Years of education: 75  . Highest education level: Not on file  Occupational History  . Occupation: [employer] manager  . Financial resource strain: Not on file  . Food insecurity:    Worry: Not on file    Inability: Not on file  . Transportation needs:    Medical: Not on file    Non-medical: Not on file  Tobacco Use  . Smoking status: Never Smoker  . Smokeless tobacco: Never Used  Substance and Sexual Activity  . Alcohol use: Yes    Comment: occasionally  . Drug use: No  . Sexual activity: Not on file  Lifestyle  . Physical activity:    Days per week: Not on file    Minutes per session: Not on file  . Stress: Not on file  Relationships  . Social connections:    Talks on phone: Not on file    Gets  together: Not on file    Attends religious service: Not on file    Active member of club or organization: Not on file    Attends meetings of clubs or organizations: Not on file    Relationship status: Not on file  Other Topics Concern  . Not on file  Social History Narrative   Drinks 1 cup a caffeine a day      Family History: The patient's family history includes Arrhythmia in his brother; Heart attack in his father; Heart disease in his father; Parkinson's disease in his mother.  ROS:   Please see the history of present illness.    ROS  All other systems reviewed and negative.   EKGs/Labs/Other Studies Reviewed:    The following studies were reviewed today: PAP downlaod  EKG:  EKG is not ordered today.   Recent Labs: No results found for requested labs within last 8760 hours.   Recent Lipid Panel    Component Value Date/Time   CHOL 164 06/11/2016 0907   TRIG 110 06/11/2016 0907   HDL 48 06/11/2016 0907   CHOLHDL 3.4 06/11/2016 0907   LDLCALC 94 06/11/2016 0907     Physical Exam:    VS:  Ht 6\' 3"  (1.905 m)   BMI 21.87 kg/m     Wt Readings from Last 3 Encounters:  04/09/17 175 lb (79.4 kg)  03/25/17 178 lb 9.6 oz (81 kg)  03/22/17 180 lb (81.6 kg)     GEN:  Well nourished, well developed in no acute distress HEENT: Normal NECK: No JVD; No carotid bruits LYMPHATICS: No lymphadenopathy CARDIAC: RRR, no murmurs, rubs, gallops RESPIRATORY:  Clear to auscultation without rales, wheezing or rhonchi  ABDOMEN: Soft, non-tender, non-distended MUSCULOSKELETAL:  No edema; No deformity  SKIN: Warm and dry NEUROLOGIC:  Alert and oriented x 3 PSYCHIATRIC:  Normal affect   ASSESSMENT:    1. OSA (obstructive sleep apnea)    PLAN:    In order of problems listed above:  1.  OSA - the patient is tolerating PAP therapy but is having problems with feeling like the pressure is too high and it is blowing his nasal pillows out of his nose.  Interestingly the pressure is still set the same as it was on the old CPAP device.. The PAP download was reviewed today and showed an AHI of 3.4/hr on 11 cm H2O with 87% compliance in using more than 4 hours nightly.  It does show significant mask leak.  I have recommending doing a 2-week auto titration from 4 to 12 cm water pressure to see if we can lower the pressure any.  I have encouraged him to try to go to bed earlier and at least get 7 hours of sleep as some nights he is only getting 6 hours of sleep and is likely contributing to his feeling tired  during the day    Medication Adjustments/Labs and Tests Ordered: Current medicines are reviewed at length with the patient today.  Concerns regarding medicines are outlined above.  No orders of the defined types were placed in this encounter.  No orders of the defined types were placed in this encounter.   Signed, Fransico Him, MD  05/11/2017 9:47 AM    [place]

## 2017-05-21 ENCOUNTER — Telehealth: Payer: Self-pay | Admitting: Cardiology

## 2017-05-21 NOTE — Telephone Encounter (Signed)
New Message:      Pt is calling in reference to his cpap update. Pt states he was told to call 2 wks after using machine

## 2017-05-21 NOTE — Telephone Encounter (Signed)
New Message   Patient is requesting a call back to discuss his CPAP and a discrepancy that he has with [facility]. He indicates that he does not want to speak with Gae Bon and only wants to speak with either Rena or Dr. Radford Pax. Please call.

## 2017-05-25 NOTE — Telephone Encounter (Signed)
I spoke with patient. He stated he spoke with Gae Bon today. He was informed by [facility] that he did not have a PAP that could be set to auto. Patient was given a different CPAP that could be auto-titrated. He will be on that for 2 weeks and then [facility] will send a download. Per D/L will determine further recommendations. Patient stated understanding and thankful for the call.

## 2017-05-28 NOTE — Telephone Encounter (Signed)
I spoke with patient. He stated he spoke with Gae Bon today. He was informed by [facility] that he did not have a PAP that could be set to auto. Patient was given a different CPAP that could be auto-titrated. He will be on that for 2 weeks and then [facility] will send a download. Per D/L will determine further recommendations. Patient stated understanding and thankful for the call.

## 2017-06-01 DIAGNOSIS — G4733 Obstructive sleep apnea (adult) (pediatric): Secondary | ICD-10-CM | POA: Diagnosis not present

## 2017-06-11 ENCOUNTER — Telehealth: Payer: Self-pay | Admitting: *Deleted

## 2017-06-11 DIAGNOSIS — G4733 Obstructive sleep apnea (adult) (pediatric): Secondary | ICD-10-CM

## 2017-06-11 NOTE — Telephone Encounter (Signed)
-----   Message from Teressa Senter, [place] sent at 06/11/2017  8:14 AM EDT ----- To Gae Bon

## 2017-06-11 NOTE — Telephone Encounter (Signed)
Order sent for Set CPAP at 7cm H2O and get a download in 4 weeks

## 2017-07-01 DIAGNOSIS — L821 Other seborrheic keratosis: Secondary | ICD-10-CM | POA: Diagnosis not present

## 2017-07-01 DIAGNOSIS — L570 Actinic keratosis: Secondary | ICD-10-CM | POA: Diagnosis not present

## 2017-07-01 DIAGNOSIS — L218 Other seborrheic dermatitis: Secondary | ICD-10-CM | POA: Diagnosis not present

## 2017-07-02 DIAGNOSIS — G4733 Obstructive sleep apnea (adult) (pediatric): Secondary | ICD-10-CM | POA: Diagnosis not present

## 2017-07-05 ENCOUNTER — Encounter: Payer: Self-pay | Admitting: Internal Medicine

## 2017-07-05 ENCOUNTER — Ambulatory Visit (INDEPENDENT_AMBULATORY_CARE_PROVIDER_SITE_OTHER): Payer: 59 | Admitting: Internal Medicine

## 2017-07-05 VITALS — BP 110/68 | HR 66 | Ht 75.0 in | Wt 182.4 lb

## 2017-07-05 DIAGNOSIS — I480 Paroxysmal atrial fibrillation: Secondary | ICD-10-CM

## 2017-07-05 MED ORDER — FLECAINIDE ACETATE 100 MG PO TABS: 100.0000 mg | ORAL_TABLET | Freq: Two times a day (BID) | ORAL | 3 refills | Status: DC

## 2017-07-05 MED ORDER — DILTIAZEM HCL 30 MG PO TABS: ORAL_TABLET | ORAL | 3 refills | Status: DC

## 2017-07-05 NOTE — Progress Notes (Signed)
HPI Mr. Creveling returns today for ongoing evaluation and management of paroxysmal atrial fibrillation.  When I saw him last, he had had an increase in his episodes of atrial fibrillation and I uptitrated his flecainide to 100 mg twice daily.  In the interim he has done well with no symptomatic atrial fibrillation.  In addition he has had some problems with his obstructive sleep apnea, specifically not being able to keep the mask on correctly and has had the CPAP adjusted, down titrating his pressures.  This appears to be working very nicely.  When I saw him last, I recommended that he upgrade to an apple for watch as he was planning to do so and he has not yet done this.  He is pending hernia surgery in several months.  The patient remains very active walking over 10,000 steps every day, and some days walking over 15,000 steps.  He denies anginal symptoms.  No shortness of breath with exertion.  No peripheral edema. No Known Allergies   Current Outpatient Medications  Medication Sig Dispense Refill  . aspirin EC 81 MG tablet Take 81 mg by mouth daily.    Marland Kitchen diltiazem (CARDIZEM) 30 MG tablet Cardizem 30mg  -- take 1 tablet every 4 hours AS NEEDED for AFIB heart rate >100 45 tablet 0  . flecainide (TAMBOCOR) 100 MG tablet Take 1 tablet (100 mg total) by mouth 2 (two) times daily. 180 tablet 3  . NON FORMULARY CPAP     No current facility-administered medications for this visit.      Past Medical History:  Diagnosis Date  . Allergic rhinitis   . Epididymal cyst    h/o   . H/O actinic keratosis    Treated sith Aldara periodically by Dr Ubaldo Glassing.  . H/O ganglion cyst    in the left wrist that resolved.  . Onychomycosis    treated w lamisil  . OSA (obstructive sleep apnea)    mild w AHI 14/hr now on 11cm H2O  . PAF (paroxysmal atrial fibrillation) (HCC)    w CHADS VASC score 0-on ASA    ROS:   All systems reviewed and negative except as noted in the HPI.   No past surgical history  on file.   Family History  Problem Relation Age of Onset  . Parkinson's disease Mother   . Heart attack Father   . Heart disease Father   . Arrhythmia Brother      Social History   Socioeconomic History  . Marital status: Married    Spouse name: Not on file  . Number of children: 2  . Years of education: 6  . Highest education level: Not on file  Occupational History  . Occupation: [employer] manager  . Financial resource strain: Not on file  . Food insecurity:    Worry: Not on file    Inability: Not on file  . Transportation needs:    Medical: Not on file    Non-medical: Not on file  Tobacco Use  . Smoking status: Never Smoker  . Smokeless tobacco: Never Used  Substance and Sexual Activity  . Alcohol use: Yes    Comment: occasionally  . Drug use: No  . Sexual activity: Not on file  Lifestyle  . Physical activity:    Days per week: Not on file    Minutes per session: Not on file  . Stress: Not on file  Relationships  . Social connections:  Talks on phone: Not on file    Gets together: Not on file    Attends religious service: Not on file    Active member of club or organization: Not on file    Attends meetings of clubs or organizations: Not on file    Relationship status: Not on file  . Intimate partner violence:    Fear of current or ex partner: Not on file    Emotionally abused: Not on file    Physically abused: Not on file    Forced sexual activity: Not on file  Other Topics Concern  . Not on file  Social History Narrative   Drinks 1 cup a caffeine a day      BP 110/68   Pulse 66   Ht 6\' 3"  (1.905 m)   Wt 182 lb 6.4 oz (82.7 kg)   BMI 22.80 kg/m   Physical Exam:  Well appearing middle-aged man, no acute distress NAD HEENT: Unremarkable Neck: 6 cm JVD, no thyromegally Lymphatics:  No adenopathy Back:  No CVA tenderness Lungs:  Clear, with no wheezes, rales, or rhonchi HEART:  Regular rate rhythm, no murmurs, no rubs, no  clicks Abd:  soft, positive bowel sounds, no organomegally, no rebound, no guarding Ext:  2 plus pulses, no edema, no cyanosis, no clubbing Skin:  No rashes no nodules Neuro:  CN II through XII intact, motor grossly intact  EKG -normal sinus rhythm, QRS duration 100 ms  Assess/Plan: 1.  Paroxysmal atrial fibrillation -he is maintaining sinus rhythm very nicely.  It has been 6 years since he has had a 2D echo, and I would anticipate planning to order one when I see him back in 5 to 6 months.  At some point, he will likely require ablation of his atrial fibrillation and if and when this should occur, we would want to know what his chamber sizes were.  The patient's chads vascular score is 0.  He will continue low-dose aspirin. 2. Sleep apnea -his CPAP is been re-titrated and appears to be working normally.  Mikle Bosworth.D.

## 2017-07-05 NOTE — Patient Instructions (Addendum)
Medication Instructions:  Your physician recommends that you continue on your current medications as directed. Please refer to the Current Medication list given to you today.  Labwork: None ordered.  Testing/Procedures: None ordered.  Follow-Up: Your physician wants you to follow-up in: 5 months with Dr. Lovena Le.   You will receive a reminder letter in the mail two months in advance. If you don't receive a letter, please call our office to schedule the follow-up appointment.  Any Other Special Instructions Will Be Listed Below (If Applicable).  If you need a refill on your cardiac medications before your next appointment, please call your pharmacy.

## 2017-07-07 ENCOUNTER — Encounter: Payer: Self-pay | Admitting: Internal Medicine

## 2017-07-21 ENCOUNTER — Telehealth: Payer: Self-pay | Admitting: *Deleted

## 2017-07-21 NOTE — Telephone Encounter (Signed)
Informed patient of compliance results and patient understanding was verbalized. Patient is aware and agreeable to AHI being within range at 2.8. Patient is aware and agreeable to improving his compliance with machine usage. Patient states he has been traveling and used his old machine while he was gone and uses the new machine when is is at home.

## 2017-07-21 NOTE — Telephone Encounter (Signed)
-----   Message from Sueanne Margarita, MD sent at 07/21/2017  1:40 PM EDT ----- Good AHI on PAP but needs to improve compliance

## 2017-08-01 DIAGNOSIS — G4733 Obstructive sleep apnea (adult) (pediatric): Secondary | ICD-10-CM | POA: Diagnosis not present

## 2017-09-01 DIAGNOSIS — G4733 Obstructive sleep apnea (adult) (pediatric): Secondary | ICD-10-CM | POA: Diagnosis not present

## 2017-09-20 DIAGNOSIS — K4090 Unilateral inguinal hernia, without obstruction or gangrene, not specified as recurrent: Secondary | ICD-10-CM | POA: Diagnosis not present

## 2017-10-02 DIAGNOSIS — G4733 Obstructive sleep apnea (adult) (pediatric): Secondary | ICD-10-CM | POA: Diagnosis not present

## 2017-10-26 ENCOUNTER — Telehealth: Payer: Self-pay | Admitting: Internal Medicine

## 2017-10-26 DIAGNOSIS — I480 Paroxysmal atrial fibrillation: Secondary | ICD-10-CM

## 2017-10-26 NOTE — Telephone Encounter (Signed)
New Message:     Patient is requesting to have a echocardiogram, please put in the order to schedule appointment.    Per the patient at Dr. Lovena Le request

## 2017-10-26 NOTE — Telephone Encounter (Signed)
Order placed for ECHO per last OV note by Dr. Lovena Le. Pt due for 5 mo f/u after ECHO.  Will send to scheduling.

## 2017-10-27 ENCOUNTER — Other Ambulatory Visit: Payer: Self-pay

## 2017-10-27 ENCOUNTER — Ambulatory Visit (HOSPITAL_COMMUNITY): Payer: 59 | Attending: Cardiology

## 2017-10-27 DIAGNOSIS — I480 Paroxysmal atrial fibrillation: Secondary | ICD-10-CM | POA: Diagnosis not present

## 2017-10-29 DIAGNOSIS — L570 Actinic keratosis: Secondary | ICD-10-CM | POA: Diagnosis not present

## 2017-11-01 DIAGNOSIS — G4733 Obstructive sleep apnea (adult) (pediatric): Secondary | ICD-10-CM | POA: Diagnosis not present

## 2017-11-18 DIAGNOSIS — G4733 Obstructive sleep apnea (adult) (pediatric): Secondary | ICD-10-CM | POA: Diagnosis not present

## 2017-11-18 DIAGNOSIS — I4891 Unspecified atrial fibrillation: Secondary | ICD-10-CM | POA: Diagnosis not present

## 2017-11-18 DIAGNOSIS — Z13220 Encounter for screening for lipoid disorders: Secondary | ICD-10-CM | POA: Diagnosis not present

## 2017-11-18 DIAGNOSIS — Z1211 Encounter for screening for malignant neoplasm of colon: Secondary | ICD-10-CM | POA: Diagnosis not present

## 2017-11-18 DIAGNOSIS — Z0000 Encounter for general adult medical examination without abnormal findings: Secondary | ICD-10-CM | POA: Diagnosis not present

## 2017-12-02 DIAGNOSIS — G4733 Obstructive sleep apnea (adult) (pediatric): Secondary | ICD-10-CM | POA: Diagnosis not present

## 2018-01-11 DIAGNOSIS — H5211 Myopia, right eye: Secondary | ICD-10-CM | POA: Diagnosis not present

## 2018-01-11 DIAGNOSIS — D3131 Benign neoplasm of right choroid: Secondary | ICD-10-CM | POA: Diagnosis not present

## 2018-03-15 ENCOUNTER — Encounter: Payer: Self-pay | Admitting: Internal Medicine

## 2018-03-29 ENCOUNTER — Telehealth: Payer: Self-pay

## 2018-03-29 MED ORDER — FLECAINIDE ACETATE 100 MG PO TABS: 100.0000 mg | ORAL_TABLET | Freq: Two times a day (BID) | ORAL | 3 refills | Status: DC

## 2018-03-29 NOTE — Telephone Encounter (Signed)
Call placed to Pt.  Advised at this time-routine follow up appointments are being cancelled to reduce possible exposure for our patients.  Per Pt-feels fine.  No current issues other than having difficulty getting flecainide filled in 3 month increments. Resent prescription to [facility].  Advised appointment would be cancelled and Pt would be contacted to reschedule.  Advised to call office if any needs.

## 2018-03-30 ENCOUNTER — Ambulatory Visit: Payer: 59 | Admitting: Internal Medicine

## 2019-05-28 ENCOUNTER — Other Ambulatory Visit: Payer: Self-pay | Admitting: Internal Medicine

## 2019-06-21 ENCOUNTER — Other Ambulatory Visit: Payer: Self-pay | Admitting: Internal Medicine

## 2019-07-04 ENCOUNTER — Other Ambulatory Visit: Payer: Self-pay | Admitting: Internal Medicine

## 2019-12-04 ENCOUNTER — Telehealth: Payer: Self-pay | Admitting: *Deleted

## 2019-12-04 NOTE — Telephone Encounter (Signed)
   Primary Cardiologist: Dr. Lovena Le  Chart reviewed as part of pre-operative protocol coverage. Patient has not been seen since 06/2017. Therefore, he will require a follow-up visit in order to better assess preoperative cardiovascular risk.  Pre-op covering staff: - Please schedule appointment and call patient to inform them. If patient already had an upcoming appointment within acceptable timeframe, please add "pre-op clearance" to the appointment notes so provider is aware. - Please contact requesting surgeon's office via preferred method (i.e, phone, fax) to inform them of need for appointment prior to surgery.  If applicable, this message will also be routed to pharmacy pool and/or primary cardiologist for input on holding anticoagulant/antiplatelet agent as requested below so that this information is available to the clearing provider at time of patient's appointment.   I will remove from pre-op pool.  Shawn Mclean, PA-C  12/04/2019, 3:08 PM

## 2019-12-04 NOTE — Telephone Encounter (Signed)
° °  [facility] Pre-operative Risk Assessment    HEARTCARE STAFF: - Please ensure there is not already an duplicate clearance open for this procedure. - Under Visit Info/Reason for Call, type in Other and utilize the format Clearance MM/DD/YY or Clearance TBD. Do not use dashes or single digits. - If request is for dental extraction, please clarify the # of teeth to be extracted.  Request for surgical clearance:  1. What type of surgery is being performed? RIGHT SHOULDER SAD/DCR/RCR   2. When is this surgery scheduled? TBD   3. What type of clearance is required (medical clearance vs. Pharmacy clearance to hold med vs. Both)? MEDICAL  4. Are there any medications that need to be held prior to surgery and how long? ASA; SURGEON WOULD LIKE INSTRUCTIONS ON HOW LONG TO HOLD ASA PRIOR AND POST SURGERY    5. Practice name and name of physician performing surgery? [facility]; DR. Mitzi Hansen [place]   6. What is the office phone number? [phone]   7.   What is the office fax number? [place]   Anesthesia type (None, local, MAC, general) ? NOT LISTED   Julaine Hua 12/04/2019, 2:39 PM  _________________________________________________________________   (provider comments below)

## 2019-12-29 ENCOUNTER — Telehealth: Payer: Self-pay | Admitting: Internal Medicine

## 2019-12-29 NOTE — Telephone Encounter (Signed)
      12/29/19 8:56 AM Note Shawn Fields is well known to me. He is low risk for shoulder surgery and may proceed.   Carleene Overlie Taylor,MD

## 2019-12-29 NOTE — Telephone Encounter (Signed)
   Primary Cardiologist: Dr. [name]  Chart reviewed as part of pre-operative protocol coverage. Given past medical history and time since last visit, based on ACC/AHA guidelines, Shawn Fields would be at acceptable risk for the planned procedure without further cardiovascular testing.  Patient may hold aspirin for 5 to 7 days prior to the procedure and restart as soon as possible afterward at the surgeon's discretion.    The patient was advised that if he develops new symptoms prior to surgery to contact our office to arrange for a follow-up visit, and he verbalized understanding.  I will route this recommendation to the requesting party via Epic fax function and remove from pre-op pool.  Please call with questions.  [place], [place] 12/29/2019, 3:27 PM

## 2019-12-29 NOTE — Telephone Encounter (Signed)
Shawn Fields is well known to me. He is low risk for shoulder surgery and may proceed.   Carleene Overlie Merica Prell,MD

## 2019-12-29 NOTE — Telephone Encounter (Signed)
Left message for Pt advising appt on Monday was cancelled and Pt approved for procedure.  Please complete

## 2020-01-01 ENCOUNTER — Ambulatory Visit: Payer: Self-pay | Admitting: Student

## 2020-03-21 DIAGNOSIS — G8918 Other acute postprocedural pain: Secondary | ICD-10-CM | POA: Diagnosis not present

## 2020-03-21 DIAGNOSIS — S43431A Superior glenoid labrum lesion of right shoulder, initial encounter: Secondary | ICD-10-CM | POA: Diagnosis not present

## 2020-03-21 DIAGNOSIS — Y999 Unspecified external cause status: Secondary | ICD-10-CM | POA: Diagnosis not present

## 2020-03-21 DIAGNOSIS — M19011 Primary osteoarthritis, right shoulder: Secondary | ICD-10-CM | POA: Diagnosis not present

## 2020-03-21 DIAGNOSIS — X58XXXA Exposure to other specified factors, initial encounter: Secondary | ICD-10-CM | POA: Diagnosis not present

## 2020-03-21 DIAGNOSIS — M7541 Impingement syndrome of right shoulder: Secondary | ICD-10-CM | POA: Diagnosis not present

## 2020-03-28 DIAGNOSIS — M25611 Stiffness of right shoulder, not elsewhere classified: Secondary | ICD-10-CM | POA: Diagnosis not present

## 2020-03-28 DIAGNOSIS — M25511 Pain in right shoulder: Secondary | ICD-10-CM | POA: Diagnosis not present

## 2020-04-03 DIAGNOSIS — Z4789 Encounter for other orthopedic aftercare: Secondary | ICD-10-CM | POA: Diagnosis not present

## 2020-04-09 DIAGNOSIS — M25511 Pain in right shoulder: Secondary | ICD-10-CM | POA: Diagnosis not present

## 2020-04-09 DIAGNOSIS — M25611 Stiffness of right shoulder, not elsewhere classified: Secondary | ICD-10-CM | POA: Diagnosis not present

## 2020-04-11 DIAGNOSIS — M25511 Pain in right shoulder: Secondary | ICD-10-CM | POA: Diagnosis not present

## 2020-04-11 DIAGNOSIS — M25611 Stiffness of right shoulder, not elsewhere classified: Secondary | ICD-10-CM | POA: Diagnosis not present

## 2020-04-18 DIAGNOSIS — M25611 Stiffness of right shoulder, not elsewhere classified: Secondary | ICD-10-CM | POA: Diagnosis not present

## 2020-04-18 DIAGNOSIS — M25511 Pain in right shoulder: Secondary | ICD-10-CM | POA: Diagnosis not present

## 2020-04-23 DIAGNOSIS — M25611 Stiffness of right shoulder, not elsewhere classified: Secondary | ICD-10-CM | POA: Diagnosis not present

## 2020-04-23 DIAGNOSIS — M25511 Pain in right shoulder: Secondary | ICD-10-CM | POA: Diagnosis not present

## 2020-04-25 DIAGNOSIS — M25611 Stiffness of right shoulder, not elsewhere classified: Secondary | ICD-10-CM | POA: Diagnosis not present

## 2020-04-25 DIAGNOSIS — M25511 Pain in right shoulder: Secondary | ICD-10-CM | POA: Diagnosis not present

## 2020-04-30 DIAGNOSIS — M25611 Stiffness of right shoulder, not elsewhere classified: Secondary | ICD-10-CM | POA: Diagnosis not present

## 2020-04-30 DIAGNOSIS — M25511 Pain in right shoulder: Secondary | ICD-10-CM | POA: Diagnosis not present

## 2020-05-02 DIAGNOSIS — M25511 Pain in right shoulder: Secondary | ICD-10-CM | POA: Diagnosis not present

## 2020-05-02 DIAGNOSIS — H25011 Cortical age-related cataract, right eye: Secondary | ICD-10-CM | POA: Diagnosis not present

## 2020-05-02 DIAGNOSIS — M25611 Stiffness of right shoulder, not elsewhere classified: Secondary | ICD-10-CM | POA: Diagnosis not present

## 2020-05-02 DIAGNOSIS — D3131 Benign neoplasm of right choroid: Secondary | ICD-10-CM | POA: Diagnosis not present

## 2020-05-14 DIAGNOSIS — M25611 Stiffness of right shoulder, not elsewhere classified: Secondary | ICD-10-CM | POA: Diagnosis not present

## 2020-05-14 DIAGNOSIS — M25511 Pain in right shoulder: Secondary | ICD-10-CM | POA: Diagnosis not present

## 2020-05-20 DIAGNOSIS — M25611 Stiffness of right shoulder, not elsewhere classified: Secondary | ICD-10-CM | POA: Diagnosis not present

## 2020-05-20 DIAGNOSIS — M25511 Pain in right shoulder: Secondary | ICD-10-CM | POA: Diagnosis not present

## 2020-05-22 DIAGNOSIS — M25511 Pain in right shoulder: Secondary | ICD-10-CM | POA: Diagnosis not present

## 2020-05-22 DIAGNOSIS — M25611 Stiffness of right shoulder, not elsewhere classified: Secondary | ICD-10-CM | POA: Diagnosis not present

## 2020-05-24 DIAGNOSIS — M25611 Stiffness of right shoulder, not elsewhere classified: Secondary | ICD-10-CM | POA: Diagnosis not present

## 2020-05-24 DIAGNOSIS — M25511 Pain in right shoulder: Secondary | ICD-10-CM | POA: Diagnosis not present

## 2020-05-28 DIAGNOSIS — M25611 Stiffness of right shoulder, not elsewhere classified: Secondary | ICD-10-CM | POA: Diagnosis not present

## 2020-05-28 DIAGNOSIS — M25511 Pain in right shoulder: Secondary | ICD-10-CM | POA: Diagnosis not present

## 2020-05-30 DIAGNOSIS — M25511 Pain in right shoulder: Secondary | ICD-10-CM | POA: Diagnosis not present

## 2020-05-30 DIAGNOSIS — M25611 Stiffness of right shoulder, not elsewhere classified: Secondary | ICD-10-CM | POA: Diagnosis not present

## 2020-06-04 DIAGNOSIS — M25611 Stiffness of right shoulder, not elsewhere classified: Secondary | ICD-10-CM | POA: Diagnosis not present

## 2020-06-04 DIAGNOSIS — M25511 Pain in right shoulder: Secondary | ICD-10-CM | POA: Diagnosis not present

## 2020-06-13 DIAGNOSIS — M25511 Pain in right shoulder: Secondary | ICD-10-CM | POA: Diagnosis not present

## 2020-06-13 DIAGNOSIS — M25611 Stiffness of right shoulder, not elsewhere classified: Secondary | ICD-10-CM | POA: Diagnosis not present

## 2020-06-21 DIAGNOSIS — Z4789 Encounter for other orthopedic aftercare: Secondary | ICD-10-CM | POA: Diagnosis not present

## 2020-07-01 DIAGNOSIS — R202 Paresthesia of skin: Secondary | ICD-10-CM | POA: Diagnosis not present

## 2020-07-01 DIAGNOSIS — G2581 Restless legs syndrome: Secondary | ICD-10-CM | POA: Diagnosis not present

## 2020-07-01 DIAGNOSIS — R531 Weakness: Secondary | ICD-10-CM | POA: Diagnosis not present

## 2020-07-01 DIAGNOSIS — Z820 Family history of epilepsy and other diseases of the nervous system: Secondary | ICD-10-CM | POA: Diagnosis not present

## 2020-07-04 DIAGNOSIS — M25511 Pain in right shoulder: Secondary | ICD-10-CM | POA: Diagnosis not present

## 2020-07-04 DIAGNOSIS — M25611 Stiffness of right shoulder, not elsewhere classified: Secondary | ICD-10-CM | POA: Diagnosis not present

## 2020-07-06 ENCOUNTER — Other Ambulatory Visit: Payer: Self-pay | Admitting: Internal Medicine

## 2020-07-08 NOTE — Telephone Encounter (Signed)
Pt will require follow up visit for further refills.  Message sent to scheduling.

## 2020-07-18 DIAGNOSIS — Z4789 Encounter for other orthopedic aftercare: Secondary | ICD-10-CM | POA: Diagnosis not present

## 2020-08-02 DIAGNOSIS — M25511 Pain in right shoulder: Secondary | ICD-10-CM | POA: Diagnosis not present

## 2020-08-02 DIAGNOSIS — Z4789 Encounter for other orthopedic aftercare: Secondary | ICD-10-CM | POA: Diagnosis not present

## 2020-08-27 DIAGNOSIS — M7501 Adhesive capsulitis of right shoulder: Secondary | ICD-10-CM | POA: Diagnosis not present

## 2020-08-28 DIAGNOSIS — M25611 Stiffness of right shoulder, not elsewhere classified: Secondary | ICD-10-CM | POA: Diagnosis not present

## 2020-08-28 DIAGNOSIS — M25511 Pain in right shoulder: Secondary | ICD-10-CM | POA: Diagnosis not present

## 2020-08-29 ENCOUNTER — Other Ambulatory Visit: Payer: Self-pay | Admitting: Neurology

## 2020-08-29 ENCOUNTER — Encounter: Payer: Self-pay | Admitting: Neurology

## 2020-08-29 ENCOUNTER — Ambulatory Visit (INDEPENDENT_AMBULATORY_CARE_PROVIDER_SITE_OTHER): Payer: BC Managed Care – PPO | Admitting: Neurology

## 2020-08-29 VITALS — BP 112/68 | HR 63 | Ht 75.0 in | Wt 185.0 lb

## 2020-08-29 DIAGNOSIS — G4761 Periodic limb movement disorder: Secondary | ICD-10-CM

## 2020-08-29 DIAGNOSIS — D649 Anemia, unspecified: Secondary | ICD-10-CM | POA: Insufficient documentation

## 2020-08-29 DIAGNOSIS — D508 Other iron deficiency anemias: Secondary | ICD-10-CM

## 2020-08-29 DIAGNOSIS — G4733 Obstructive sleep apnea (adult) (pediatric): Secondary | ICD-10-CM

## 2020-08-29 DIAGNOSIS — I480 Paroxysmal atrial fibrillation: Secondary | ICD-10-CM

## 2020-08-29 DIAGNOSIS — G478 Other sleep disorders: Secondary | ICD-10-CM | POA: Diagnosis not present

## 2020-08-29 DIAGNOSIS — G2581 Restless legs syndrome: Secondary | ICD-10-CM

## 2020-08-29 DIAGNOSIS — Z9989 Dependence on other enabling machines and devices: Secondary | ICD-10-CM | POA: Diagnosis not present

## 2020-08-29 NOTE — Progress Notes (Signed)
SLEEP MEDICINE CLINIC    Provider:  Larey Seat, MD  Primary Care Physician:  Antony Contras, MD [place] [address]     Referring Provider: Garvin Fila, Md [address] [place],  [place] [address]   Sleep provider ; Dr Golden Hurter, MD          Chief Complaint according to patient   Patient presents with:     New Patient (Initial Visit)           HISTORY OF PRESENT ILLNESS:  Shawn Fields is a 60 y.o. year old White or Caucasian male patient seen upon referral on 08/29/2020 from primary neurologist Dr. Leonie Man, MD  for a sleep consultation on a patient that already is established with Dr. Jeanella Flattery clinic, followed for CPAP and for atrial fibrillation ( onset in 2013) .  Older brother has atrial fibrillation, father died with a massive heart attack. That let to his sleep study,and the patient now presents with RLS, had a mother with PD.    Chief concern according to patient : see above -    I have the pleasure of seeing Shawn Fields today, a right-handed White or Caucasian male with a known sleep disorder and actively followed by Golden Hurter, MD.    He  has a past medical history of Allergic rhinitis, Epididymal cyst, H/O actinic keratosis, H/O ganglion cyst, Onychomycosis, OSA (obstructive sleep apnea), and PAF (paroxysmal atrial fibrillation) ([place]). Type A , Chronically on flecainide, and on ASA. Became fatigued on Cardiazem, none now.    The patient had the first sleep study in the year 2013 at [facility] in lab-  PLMs arousals 3.3 /h,  with a result of an AHI ( Apnea Hypopnea index)  of 14/h, a RDI ( Respiratory Disturbance Index) of 20. No comment on CPAP titration about RLS, PLMs and PLMs in REM sleep. He reports he is now acting out dreams, kicking occassionally, This started 15 years ago already, no happens every month. He had shoulder surgery, and after that he would wake up tingling , electric shock sensation .      Family medical /sleep history: Sister with CPAP/ OSA, brother atrial fib, mother had RLS and PD> .    Social history:  Patient is working in [place].  and lives in a household with spouse . Has  age 46 and 76 years of children.  The patient currently works for himself.  Tobacco use/.  ETOH use / 4 / week, Caffeine intake in form of Coffee( 1 cup in AM) Tea ( at lunch).    Sleep habits are as follows: The patient's dinner time is between 6.30-8 PM. The patient goes to bed at 11.30 PM and continues to sleep for 7-8 hours, wakes for 1-2 bathroom breaks, the first time at 3 AM.   The preferred sleep position is left side, with the support of 1-2 pillows. Dreams are reportedly frequent/vivid.  8  AM is the usual rise time. The patient wakes up spontaneously.  He is a CPAP user.  He reports feeling refreshed or restored in AM, no longer with symptoms such as dry mouth, morning headaches, and residual fatigue. Naps are not  taken .   Review of Systems: Out of a complete 14 system review, the patient complains of only the following symptoms, and all other reviewed systems are negative.:    Patient is on CPAP.   Has  been kicking, twitching and acting out dreams.    How likely are you to doze in the following situations: 0 = not likely, 1 = slight chance, 2 = moderate chance, 3 = high chance   Sitting and Reading? Watching Television? Sitting inactive in a public place (theater or meeting)? As a passenger in a car for an hour without a break? Lying down in the afternoon when circumstances permit? Sitting and talking to someone? Sitting quietly after lunch without alcohol? In a car, while stopped for a few minutes in traffic?   Total = 2/ 24 points   FSS endorsed at 15/ 63 points.   Social History   Socioeconomic History   Marital status: Married    Spouse name: Not on file   Number of children: 2   Years of education: 16   Highest education level: Not on file   Occupational History   Occupation: [employer]   Tobacco Use   Smoking status: Never   Smokeless tobacco: Never  Vaping Use   Vaping Use: Never used  Substance and Sexual Activity   Alcohol use: Yes    Comment: occasionally   Drug use: No   Sexual activity: Not on file  Other Topics Concern   Not on file  Social History Narrative   Drinks 1 cup a caffeine a day    Social Determinants of Health   Financial Resource Strain: Not on file  Food Insecurity: Not on file  Transportation Needs: Not on file  Physical Activity: Not on file  Stress: Not on file  Social Connections: Not on file    Family History  Problem Relation Age of Onset   Parkinson's disease Mother    Heart attack Father    Heart disease Father    Arrhythmia Brother     Past Medical History:  Diagnosis Date   Allergic rhinitis    Epididymal cyst    h/o    H/O actinic keratosis    Treated sith Aldara periodically by Dr Shawn Fields.   H/O ganglion cyst    in the left wrist that resolved.   Onychomycosis    treated w lamisil   OSA (obstructive sleep apnea)    mild w AHI 14/hr now on 11cm H2O   PAF (paroxysmal atrial fibrillation) (HCC)    w CHADS VASC score 0-on ASA    No past surgical history on file.   Current Outpatient Medications on File Prior to Visit  Medication Sig Dispense Refill   aspirin EC 81 MG tablet Take 81 mg by mouth daily.     flecainide (TAMBOCOR) 100 MG tablet TAKE 1 TABLET BY MOUTH TWICE A DAY 180 tablet 0   JUBLIA 10 % SOLN Apply topically daily.     loratadine (CLARITIN) 10 MG tablet Take by mouth.     mupirocin ointment (BACTROBAN) 2 % SMARTSIG:1 Application Topical 2-3 Times Daily     NON FORMULARY CPAP     No current facility-administered medications on file prior to visit.    No Known Allergies  Physical exam:  Today's Vitals   08/29/20 1006  BP: 112/68  Pulse: 63  Weight: 185 lb (83.9 kg)  Height: '6\' 3"'$  (1.905 m)   Body mass index is 23.12 kg/m.   Wt  Readings from Last 3 Encounters:  08/29/20 185 lb (83.9 kg)  07/05/17 182 lb 6.4 oz (82.7 kg)  05/11/17 178 lb 4 oz (80.9 kg)     Ht Readings from Last 3 Encounters:  08/29/20 '6\' 3"'$  (1.905 m)  07/05/17 '6\' 3"'$  (1.905 m)  05/11/17 '6\' 3"'$  (1.905 m)      General: The patient is awake, alert and appears not in acute distress. The patient is well groomed. Head: Normocephalic, atraumatic. Neck is supple. Mallampati 2,  neck circumference:15 inches . Nasal airflow  patent.  Retrognathia is  seen.  Dental status:  Cardiovascular:  Regular rate and cardiac rhythm by pulse,  without distended neck veins. Respiratory: Lungs are clear to auscultation.  Skin:  Without evidence of ankle edema, or rash. Trunk: The patient's posture is erect.   Neurologic exam : The patient is awake and alert, oriented to place and time.   Memory subjective described as intact.  Attention span & concentration ability appears normal.  Speech is fluent,  without  dysarthria, dysphonia or aphasia.  Mood and affect are appropriate.   Cranial nerves: no loss of smell or taste reported   UNMASKED FACE- no facial mimic loss.  Pupils are equal and briskly reactive to light. Funduscopic exam deferred...  Extraocular movements in vertical and horizontal planes were intact and without nystagmus.  Saccades to the left were more coarse.  No Diplopia. Visual fields by finger perimetry are intact. Hearing was intact to soft voice and finger rubbing.    Facial sensation intact to fine touch.  Facial motor strength- tongue and uvula move midline. Ptosis on the right eye.  Neck ROM : rotation, tilt and flexion extension were normal for age and shoulder shrug was symmetrical.    Motor exam:  Symmetric bulk, tone and ROM.   ELEVATED Tone with cog wheeling, bilateral biceps and wrist , asymmetric grip strength . Right with tremor- left side without.    Sensory:  Fine touch, pinprick and vibration were tested  and  normal.   Proprioception tested in the upper extremities was normal.   Coordination: Rapid alternating movements in the fingers/hands were of normal speed.  Mild tremor at rest with outstretched  arms.  The Finger-to-nose maneuver was intact without evidence of ataxia, dysmetria but with satellite tremor.    Gait and station: Patient could rise unassisted from a seated position, walked without assistive device.  Stance is of normal width/ base and the patient turned with 3 steps.  Toe and heel walk were deferred.  Deep tendon reflexes: in the  upper and lower extremities are symmetric and 2 plus. Babinski response was deferred .        After spending a total time of  45  minutes face to face and additional time for physical and neurologic examination, review of laboratory studies,  personal review of imaging studies, reports and results of other testing and review of referral information / records as far as provided in visit, I have established the following assessments:  1) PLMs were present in first sleep study almost a decade ago.  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 Reports visual misperceptions, no falls, no visual hallucinations.  3) Has not used Klonopin or melatonin.    My Plan is to proceed with:  1) ATTENDED SLEEP STUDY  2) klonopin only if melatonin doesn't work.  MRI brain - not time for DAT scan yet.  3)  MOCA next time.   I would like to thank Antony Contras, MD and Garvin Fila, [address] [place],  [place] [address] for allowing me to meet with and to take care of this pleasant patient.   In  short, Shawn Fields is presenting with mild parkinsonism, PLMs and RLS-   I plan to follow up either personally or through our NP within 4 months.  Electronically signed by: Larey Seat, MD 08/29/2020 10:13 AM  [facility] Board certified by The [employer] of Sleep Medicine and Diplomate of  the [employer] of Sleep Medicine. Board certified In Neurology through the [place], Fellow of the [employer] of Neurology. Medical Director of [facility].

## 2020-08-29 NOTE — Addendum Note (Signed)
Addended by: Artist Beach on: 08/29/2020 11:23 AM   Modules accepted: Orders

## 2020-08-29 NOTE — Patient Instructions (Signed)
Restless Legs Syndrome Restless legs syndrome is a condition that causes uncomfortable feelings or sensations in the legs, especially while sitting or lying down. The sensations usually cause an overwhelming urge to move the legs. The arms can alsosometimes be affected. The condition can range from mild to severe. The symptoms often interfere witha person's ability to sleep. What are the causes? The cause of this condition is not known. What increases the risk? The following factors may make you more likely to develop this condition: Being older than 50. Pregnancy. Being a woman. In general, the condition is more common in women than in men. A family history of the condition. Having iron deficiency. Overuse of caffeine, nicotine, or alcohol. Certain medical conditions, such as kidney disease, Parkinson's disease, or nerve damage. Certain medicines, such as those for high blood pressure, nausea, colds, allergies, depression, and some heart conditions. What are the signs or symptoms? The main symptom of this condition is uncomfortable sensations in the legs, such as: Pulling. Tingling. Prickling. Throbbing. Crawling. Burning. Usually, the sensations: Affect both sides of the body. Are worse when you sit or lie down. Are worse at night. These may wake you up or make it difficult to fall asleep. Make you have a strong urge to move your legs. Are temporarily relieved by moving your legs. The arms can also be affected, but this is rare. People who have this conditionoften have tiredness during the day because of their lack of sleep at night. How is this diagnosed? This condition may be diagnosed based on: Your symptoms. Blood tests. In some cases, you may be monitored in a sleep lab by a specialist (a sleep study). This can detect any disruptions in your sleep. How is this treated? This condition is treated by managing the symptoms. This may include: Lifestyle changes, such as  exercising, using relaxation techniques, and avoiding caffeine, alcohol, or tobacco. Medicines. Anti-seizure medicines may be tried first. Follow these instructions at home: General instructions Take over-the-counter and prescription medicines only as told by your health care provider. Use methods to help relieve the uncomfortable sensations, such as: Massaging your legs. Walking or stretching. Taking a cold or hot bath. Keep all follow-up visits as told by your health care provider. This is important. Lifestyle     Practice good sleep habits. For example, go to bed and get up at the same time every day. Most adults should get 7-9 hours of sleep each night. Exercise regularly. Try to get at least 30 minutes of exercise most days of the week. Practice ways of relaxing, such as yoga or meditation. Avoid caffeine and alcohol. Do not use any products that contain nicotine or tobacco, such as cigarettes and e-cigarettes. If you need help quitting, ask your health care provider. Contact a health care provider if: Your symptoms get worse or they do not improve with treatment. Summary Restless legs syndrome is a condition that causes uncomfortable feelings or sensations in the legs, especially while sitting or lying down. The symptoms often interfere with a person's ability to sleep. This condition is treated by managing the symptoms. You may need to make lifestyle changes or take medicines. This information is not intended to replace advice given to you by your health care provider. Make sure you discuss any questions you have with your healthcare provider. Document Revised: 02/17/2019 Document Reviewed: 01/18/2017 Elsevier Patient Education  2022 Elsevier Inc.  

## 2020-08-30 ENCOUNTER — Encounter: Payer: Self-pay | Admitting: Student

## 2020-08-30 ENCOUNTER — Ambulatory Visit (INDEPENDENT_AMBULATORY_CARE_PROVIDER_SITE_OTHER): Payer: BC Managed Care – PPO | Admitting: Student

## 2020-08-30 ENCOUNTER — Other Ambulatory Visit: Payer: Self-pay

## 2020-08-30 VITALS — BP 118/68 | HR 55 | Ht 75.0 in | Wt 185.6 lb

## 2020-08-30 DIAGNOSIS — G4733 Obstructive sleep apnea (adult) (pediatric): Secondary | ICD-10-CM | POA: Diagnosis not present

## 2020-08-30 DIAGNOSIS — M25611 Stiffness of right shoulder, not elsewhere classified: Secondary | ICD-10-CM | POA: Diagnosis not present

## 2020-08-30 DIAGNOSIS — I480 Paroxysmal atrial fibrillation: Secondary | ICD-10-CM

## 2020-08-30 DIAGNOSIS — Z9989 Dependence on other enabling machines and devices: Secondary | ICD-10-CM

## 2020-08-30 DIAGNOSIS — M25511 Pain in right shoulder: Secondary | ICD-10-CM | POA: Diagnosis not present

## 2020-08-30 LAB — IRON AND TIBC
Iron Saturation: 41 % (ref 15–55)
Iron: 131 ug/dL (ref 38–169)
Total Iron Binding Capacity: 323 ug/dL (ref 250–450)
UIBC: 192 ug/dL (ref 111–343)

## 2020-08-30 LAB — FERRITIN: Ferritin: 217 ng/mL (ref 30–400)

## 2020-08-30 NOTE — Patient Instructions (Signed)
Medication Instructions:  Your physician recommends that you continue on your current medications as directed. Please refer to the Current Medication list given to you today.  *If you need a refill on your cardiac medications before your next appointment, please call your pharmacy*   Lab Work: None If you have labs (blood work) drawn today and your tests are completely normal, you will receive your results only by: MyChart Message (if you have MyChart) OR A paper copy in the mail If you have any lab test that is abnormal or we need to change your treatment, we will call you to review the results.   Follow-Up: At [facility], you and your health needs are our priority.  As part of our continuing mission to provide you with exceptional heart care, we have created designated Provider Care Teams.  These Care Teams include your primary Cardiologist (physician) and Advanced Practice Providers (APPs -  Physician Assistants and Nurse Practitioners) who all work together to provide you with the care you need, when you need it.   Your next appointment:   6 month(s)  The format for your next appointment:   In Person  Provider:   You may see Gregg Taylor, MD or one of the following Advanced Practice Providers on your designated Care Team:   Renee Ursuy, PA-C Michael "Andy" Tillery, PA-C    

## 2020-08-30 NOTE — Addendum Note (Signed)
Addended by: Glean Salen on: 08/30/2020 04:14 PM   Modules accepted: Orders

## 2020-08-30 NOTE — Progress Notes (Signed)
All normal Iron levels.

## 2020-08-30 NOTE — Progress Notes (Signed)
PCP:  Antony Contras, MD Primary Cardiologist: None Electrophysiologist: Shawn Peru, MD   Shawn Fields is a 60 y.o. male seen today for Shawn Peru, MD for routine electrophysiology followup.  Since last being seen in our clinic the patient reports doing very well. He has been through a hernia and shoulder surgery without any breakthrough AF on flecainide.  he denies chest pain, palpitations, dyspnea, PND, orthopnea, nausea, vomiting, dizziness, syncope, edema, weight gain, or early satiety.  Past Medical History:  Diagnosis Date   Allergic rhinitis    Epididymal cyst    h/o    H/O actinic keratosis    Treated sith Aldara periodically by Dr Ubaldo Glassing.   H/O ganglion cyst    in the left wrist that resolved.   Onychomycosis    treated w lamisil   OSA (obstructive sleep apnea)    mild w AHI 14/hr now on 11cm H2O   PAF (paroxysmal atrial fibrillation) (HCC)    w CHADS VASC score 0-on ASA   History reviewed. No pertinent surgical history.  Current Outpatient Medications  Medication Sig Dispense Refill   aspirin EC 81 MG tablet Take 81 mg by mouth daily.     flecainide (TAMBOCOR) 100 MG tablet TAKE 1 TABLET BY MOUTH TWICE A DAY 180 tablet 0   JUBLIA 10 % SOLN Apply topically daily.     loratadine (CLARITIN) 10 MG tablet Take by mouth.     mupirocin ointment (BACTROBAN) 2 % SMARTSIG:1 Application Topical 2-3 Times Daily     NON FORMULARY CPAP     No current facility-administered medications for this visit.    No Known Allergies  Social History   Socioeconomic History   Marital status: Married    Spouse name: Not on file   Number of children: 2   Years of education: 16   Highest education level: Not on file  Occupational History   Occupation: [employer]   Tobacco Use   Smoking status: Never   Smokeless tobacco: Never  Vaping Use   Vaping Use: Never used  Substance and Sexual Activity   Alcohol use: Yes    Comment: occasionally   Drug use: No   Sexual  activity: Not on file  Other Topics Concern   Not on file  Social History Narrative   Drinks 1 cup a caffeine a day    Social Determinants of Health   Financial Resource Strain: Not on file  Food Insecurity: Not on file  Transportation Needs: Not on file  Physical Activity: Not on file  Stress: Not on file  Social Connections: Not on file  Intimate Partner Violence: Not on file   Review of Systems: All other systems reviewed and are otherwise negative except as noted above.  Physical Exam: Vitals:   08/30/20 1016  BP: 118/68  Pulse: (!) 55  SpO2: 99%  Weight: 185 lb 9.6 oz (84.2 kg)  Height: '6\' 3"'$  (1.905 m)    GEN- The patient is well appearing, alert and oriented x 3 today.   HEENT: normocephalic, atraumatic; sclera clear, conjunctiva pink; hearing intact; oropharynx clear; neck supple, no JVP Lymph- no cervical lymphadenopathy Lungs- Clear to ausculation bilaterally, normal work of breathing.  No wheezes, rales, rhonchi Heart- Regular rate and rhythm, no murmurs, rubs or gallops, PMI not laterally displaced GI- soft, non-tender, non-distended, bowel sounds present, no hepatosplenomegaly Extremities- no clubbing, cyanosis, or edema; DP/PT/radial pulses 2+ bilaterally MS- no significant deformity or atrophy Skin- warm and dry, no rash or  lesion Psych- euthymic mood, full affect Neuro- strength and sensation are intact  EKG is ordered. Personal review of EKG from today shows sinus bradycardia at 55 bpm, stable intervals on flecainide despite quite long follow up period. (QRS 102 ms and PR of 176 ms 06/2017)  Additional studies reviewed include: Previous EP office notes.   Assessment and Plan:  1.  Paroxysmal atrial fibrillation  NSR by EKG today and by symptoms on flecainde. QRS and PR relatively stable.  He would prefer alternative AAD to ablation IF he has more AF in the future.  He didn't feel as if he had any breakthrough even with multiple surgeries since last  follow.  CHA2DS2-VASc is 0. He is on low dose ASA.  Request labs from May from PCP.   2. Sleep apnea  Wearing CPAP.   Shirley Friar, PA-C  08/30/20 10:34 AM

## 2020-09-02 ENCOUNTER — Telehealth: Payer: Self-pay | Admitting: *Deleted

## 2020-09-02 DIAGNOSIS — M25611 Stiffness of right shoulder, not elsewhere classified: Secondary | ICD-10-CM | POA: Diagnosis not present

## 2020-09-02 DIAGNOSIS — M25511 Pain in right shoulder: Secondary | ICD-10-CM | POA: Diagnosis not present

## 2020-09-02 NOTE — Telephone Encounter (Signed)
-----   Message from Larey Seat, MD sent at 08/30/2020  2:12 PM EDT ----- All normal Iron levels.

## 2020-09-02 NOTE — Telephone Encounter (Signed)
LVM for pt about lab results per Dr. Edwena Felty note. Advised he did not have to call back unless he had further questions.

## 2020-09-03 NOTE — Progress Notes (Signed)
I agree with the above plan 

## 2020-09-04 ENCOUNTER — Telehealth: Payer: Self-pay | Admitting: Neurology

## 2020-09-04 DIAGNOSIS — M25611 Stiffness of right shoulder, not elsewhere classified: Secondary | ICD-10-CM | POA: Diagnosis not present

## 2020-09-04 DIAGNOSIS — M25511 Pain in right shoulder: Secondary | ICD-10-CM | POA: Diagnosis not present

## 2020-09-04 NOTE — Telephone Encounter (Signed)
MR Brain w/wo contrast Dr. Rolla Etienne Josem Kaufmann: MD:[id number] (exp. 09/03/20 to 11/01/20). Patient is scheduled at [facility] for 09/10/20.

## 2020-09-06 DIAGNOSIS — M25611 Stiffness of right shoulder, not elsewhere classified: Secondary | ICD-10-CM | POA: Diagnosis not present

## 2020-09-06 DIAGNOSIS — M25511 Pain in right shoulder: Secondary | ICD-10-CM | POA: Diagnosis not present

## 2020-09-09 DIAGNOSIS — M25611 Stiffness of right shoulder, not elsewhere classified: Secondary | ICD-10-CM | POA: Diagnosis not present

## 2020-09-09 DIAGNOSIS — M25511 Pain in right shoulder: Secondary | ICD-10-CM | POA: Diagnosis not present

## 2020-09-10 ENCOUNTER — Other Ambulatory Visit: Payer: Self-pay

## 2020-09-10 ENCOUNTER — Ambulatory Visit: Payer: BC Managed Care – PPO

## 2020-09-10 DIAGNOSIS — G4761 Periodic limb movement disorder: Secondary | ICD-10-CM

## 2020-09-10 DIAGNOSIS — I480 Paroxysmal atrial fibrillation: Secondary | ICD-10-CM

## 2020-09-10 DIAGNOSIS — G478 Other sleep disorders: Secondary | ICD-10-CM

## 2020-09-10 DIAGNOSIS — G4733 Obstructive sleep apnea (adult) (pediatric): Secondary | ICD-10-CM

## 2020-09-10 MED ORDER — GADOBENATE DIMEGLUMINE 529 MG/ML IV SOLN
15.0000 mL | Freq: Once | INTRAVENOUS | Status: AC | PRN
  Administered 2020-09-10: 15 mL via INTRAVENOUS

## 2020-09-11 DIAGNOSIS — M25511 Pain in right shoulder: Secondary | ICD-10-CM | POA: Diagnosis not present

## 2020-09-11 DIAGNOSIS — M25611 Stiffness of right shoulder, not elsewhere classified: Secondary | ICD-10-CM | POA: Diagnosis not present

## 2020-09-12 ENCOUNTER — Telehealth: Payer: Self-pay | Admitting: *Deleted

## 2020-09-12 NOTE — Progress Notes (Signed)
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   Normal MRI brain (with and without).   INTERPRETING PHYSICIAN:  Penni Bombard, MD

## 2020-09-12 NOTE — Telephone Encounter (Signed)
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   Normal MRI brain (with and without).   INTERPRETING PHYSICIAN:  Penni Bombard, MD

## 2020-09-12 NOTE — Telephone Encounter (Signed)
Called and spoke with pt about results per Dr. Edwena Felty note. Pt verbalized understanding.

## 2020-09-18 DIAGNOSIS — M25511 Pain in right shoulder: Secondary | ICD-10-CM | POA: Diagnosis not present

## 2020-09-18 DIAGNOSIS — M25611 Stiffness of right shoulder, not elsewhere classified: Secondary | ICD-10-CM | POA: Diagnosis not present

## 2020-09-19 DIAGNOSIS — D225 Melanocytic nevi of trunk: Secondary | ICD-10-CM | POA: Diagnosis not present

## 2020-09-19 DIAGNOSIS — L570 Actinic keratosis: Secondary | ICD-10-CM | POA: Diagnosis not present

## 2020-09-19 DIAGNOSIS — L821 Other seborrheic keratosis: Secondary | ICD-10-CM | POA: Diagnosis not present

## 2020-09-19 DIAGNOSIS — D1801 Hemangioma of skin and subcutaneous tissue: Secondary | ICD-10-CM | POA: Diagnosis not present

## 2020-09-20 DIAGNOSIS — H10412 Chronic giant papillary conjunctivitis, left eye: Secondary | ICD-10-CM | POA: Diagnosis not present

## 2020-09-20 DIAGNOSIS — M25511 Pain in right shoulder: Secondary | ICD-10-CM | POA: Diagnosis not present

## 2020-09-20 DIAGNOSIS — M25611 Stiffness of right shoulder, not elsewhere classified: Secondary | ICD-10-CM | POA: Diagnosis not present

## 2020-09-20 DIAGNOSIS — H00014 Hordeolum externum left upper eyelid: Secondary | ICD-10-CM | POA: Diagnosis not present

## 2020-09-20 DIAGNOSIS — H00015 Hordeolum externum left lower eyelid: Secondary | ICD-10-CM | POA: Diagnosis not present

## 2020-09-24 DIAGNOSIS — M25611 Stiffness of right shoulder, not elsewhere classified: Secondary | ICD-10-CM | POA: Diagnosis not present

## 2020-09-24 DIAGNOSIS — M25511 Pain in right shoulder: Secondary | ICD-10-CM | POA: Diagnosis not present

## 2020-09-26 DIAGNOSIS — M25511 Pain in right shoulder: Secondary | ICD-10-CM | POA: Diagnosis not present

## 2020-09-26 DIAGNOSIS — M25611 Stiffness of right shoulder, not elsewhere classified: Secondary | ICD-10-CM | POA: Diagnosis not present

## 2020-10-01 DIAGNOSIS — M25611 Stiffness of right shoulder, not elsewhere classified: Secondary | ICD-10-CM | POA: Diagnosis not present

## 2020-10-01 DIAGNOSIS — M25511 Pain in right shoulder: Secondary | ICD-10-CM | POA: Diagnosis not present

## 2020-10-03 DIAGNOSIS — M25511 Pain in right shoulder: Secondary | ICD-10-CM | POA: Diagnosis not present

## 2020-10-03 DIAGNOSIS — M25611 Stiffness of right shoulder, not elsewhere classified: Secondary | ICD-10-CM | POA: Diagnosis not present

## 2020-10-08 DIAGNOSIS — M25611 Stiffness of right shoulder, not elsewhere classified: Secondary | ICD-10-CM | POA: Diagnosis not present

## 2020-10-08 DIAGNOSIS — M25511 Pain in right shoulder: Secondary | ICD-10-CM | POA: Diagnosis not present

## 2020-10-10 DIAGNOSIS — M25511 Pain in right shoulder: Secondary | ICD-10-CM | POA: Diagnosis not present

## 2020-10-10 DIAGNOSIS — M25611 Stiffness of right shoulder, not elsewhere classified: Secondary | ICD-10-CM | POA: Diagnosis not present

## 2020-10-11 ENCOUNTER — Other Ambulatory Visit: Payer: Self-pay | Admitting: Internal Medicine

## 2020-10-15 DIAGNOSIS — M25511 Pain in right shoulder: Secondary | ICD-10-CM | POA: Diagnosis not present

## 2020-10-15 DIAGNOSIS — M25611 Stiffness of right shoulder, not elsewhere classified: Secondary | ICD-10-CM | POA: Diagnosis not present

## 2020-10-16 DIAGNOSIS — R52 Pain, unspecified: Secondary | ICD-10-CM | POA: Diagnosis not present

## 2020-10-16 DIAGNOSIS — M25611 Stiffness of right shoulder, not elsewhere classified: Secondary | ICD-10-CM | POA: Diagnosis not present

## 2020-10-22 DIAGNOSIS — M25511 Pain in right shoulder: Secondary | ICD-10-CM | POA: Diagnosis not present

## 2020-10-22 DIAGNOSIS — M25611 Stiffness of right shoulder, not elsewhere classified: Secondary | ICD-10-CM | POA: Diagnosis not present

## 2020-10-24 DIAGNOSIS — M25511 Pain in right shoulder: Secondary | ICD-10-CM | POA: Diagnosis not present

## 2020-10-24 DIAGNOSIS — M25611 Stiffness of right shoulder, not elsewhere classified: Secondary | ICD-10-CM | POA: Diagnosis not present

## 2020-10-29 DIAGNOSIS — M25511 Pain in right shoulder: Secondary | ICD-10-CM | POA: Diagnosis not present

## 2020-10-29 DIAGNOSIS — M25611 Stiffness of right shoulder, not elsewhere classified: Secondary | ICD-10-CM | POA: Diagnosis not present

## 2020-10-30 DIAGNOSIS — H10412 Chronic giant papillary conjunctivitis, left eye: Secondary | ICD-10-CM | POA: Diagnosis not present

## 2020-10-31 DIAGNOSIS — M25611 Stiffness of right shoulder, not elsewhere classified: Secondary | ICD-10-CM | POA: Diagnosis not present

## 2020-10-31 DIAGNOSIS — M25511 Pain in right shoulder: Secondary | ICD-10-CM | POA: Diagnosis not present

## 2020-11-04 DIAGNOSIS — M25611 Stiffness of right shoulder, not elsewhere classified: Secondary | ICD-10-CM | POA: Diagnosis not present

## 2020-11-04 DIAGNOSIS — M25511 Pain in right shoulder: Secondary | ICD-10-CM | POA: Diagnosis not present

## 2020-11-07 DIAGNOSIS — M25511 Pain in right shoulder: Secondary | ICD-10-CM | POA: Diagnosis not present

## 2020-11-07 DIAGNOSIS — M25611 Stiffness of right shoulder, not elsewhere classified: Secondary | ICD-10-CM | POA: Diagnosis not present

## 2020-11-12 DIAGNOSIS — M25611 Stiffness of right shoulder, not elsewhere classified: Secondary | ICD-10-CM | POA: Diagnosis not present

## 2020-11-12 DIAGNOSIS — M25511 Pain in right shoulder: Secondary | ICD-10-CM | POA: Diagnosis not present

## 2020-11-13 ENCOUNTER — Other Ambulatory Visit: Payer: Self-pay

## 2020-11-13 ENCOUNTER — Ambulatory Visit (INDEPENDENT_AMBULATORY_CARE_PROVIDER_SITE_OTHER): Payer: BC Managed Care – PPO | Admitting: Neurology

## 2020-11-13 DIAGNOSIS — G4733 Obstructive sleep apnea (adult) (pediatric): Secondary | ICD-10-CM

## 2020-11-13 DIAGNOSIS — G4761 Periodic limb movement disorder: Secondary | ICD-10-CM

## 2020-11-13 DIAGNOSIS — I480 Paroxysmal atrial fibrillation: Secondary | ICD-10-CM

## 2020-11-13 DIAGNOSIS — G478 Other sleep disorders: Secondary | ICD-10-CM

## 2020-11-14 DIAGNOSIS — M25511 Pain in right shoulder: Secondary | ICD-10-CM | POA: Diagnosis not present

## 2020-11-14 DIAGNOSIS — M25611 Stiffness of right shoulder, not elsewhere classified: Secondary | ICD-10-CM | POA: Diagnosis not present

## 2020-11-14 NOTE — Procedures (Signed)
PATIENT'S NAME:  Shawn Fields, Shawn Fields DOB:      04/20/1960      MR#:    [id number]     DATE OF RECORDING: 11/13/2020 REFERRING M.D.:  Antony Contras, MD Study Performed:   Baseline Polysomnogram HISTORY:   Shawn Fields is a 60 year-old  Caucasian male patient seen upon referral on 08/29/2020 from primary neurologist Dr. Leonie Man, MD for a sleep consultation on a patient that already is established with Dr. Jeanella Flattery clinic, followed her for CPAP and for atrial fibrillation ( onset in 2013). Older brother has atrial fibrillation, father died with a massive heart attack. That led to his sleep study, and the patient now presents with RLS, had a mother with PD.  The patient endorsed the Epworth Sleepiness Scale at 2 points.    The patient's weight 185 pounds with a height of 75 (inches), resulting in a BMI of 23. kg/m2.  The patient's neck circumference measured 15 inches.  CURRENT MEDICATIONS: Aspirin, Tambocor, Claritin, CPAP   PROCEDURE:  This is a multichannel digital polysomnogram utilizing the Somnostar 11.2 system.  Electrodes and sensors were applied and monitored per AASM Specifications.   EEG, EOG, Chin and Limb EMG, were sampled at 200 Hz.  ECG, Snore and Nasal Pressure, Thermal Airflow, Respiratory Effort, CPAP Flow and Pressure, Oximetry was sampled at 50 Hz. Digital video and audio were recorded.      BASELINE STUDY: Lights Out was at 22:31 and Lights On at 05:06.  Total recording time (TRT) was 395.5 minutes, with a total sleep time (TST) of 292.5 minutes.   The patient's sleep latency was 47.5 minutes.  REM latency was 62.5 minutes.  The sleep efficiency was 74. %.     SLEEP ARCHITECTURE: WASO (Wake after sleep onset) was 68 minutes.  There were 25 minutes in Stage N1, 211 minutes Stage N2, 0 minutes Stage N3 and 56.5 minutes in Stage REM.  The percentage of Stage N1 was 8.5%, Stage N2 was 72.1%, Stage N3 was 0% and Stage R (REM sleep) was 19.3%.    RESPIRATORY ANALYSIS:  There were a  total of 32 respiratory events:  20 obstructive apneas, 0 central apneas and 6 mixed apneas with a total of 26 apneas and an apnea index (AI) of 5.3 /hour. There were 6 hypopneas with a hypopnea index of 1.2 /hour. The patient also had 0 respiratory event related arousals (RERAs).      The total APNEA/HYPOPNEA INDEX (AHI) was 6/hour and the total RESPIRATORY DISTURBANCE INDEX was  6.6 /hour.  0 events occurred in REM sleep and 18 events in NREM. The REM AHI was  0 /hour, versus a non-REM AHI of 8.1. The patient spent 38.5 minutes of total sleep time in the supine position and 254 minutes in non-supine. The supine AHI was 46.8 versus a non-supine AHI of 0.5.  OXYGEN SATURATION & C02:  The Wake baseline 02 saturation was 93%, with the lowest being 89%. Time spent below 89% saturation equaled 0 minutes.  The arousals were noted as: 43 were spontaneous, 0 were associated with PLMs, 29 were associated with respiratory events. The patient had a total of 0 Periodic Limb Movements. Audio and video analysis did not show any abnormal or unusual movements, behaviors, phonations or vocalizations.   EKG was in keeping with normal sinus rhythm (NSR).   IMPRESSION:  This patient has mildest overall Obstructive Sleep Apnea(OSA), AHI of 6/h (!) and AHI was clearly positional dependent.  NO PLMS WERE SEEN.  RECOMMENDATIONS: avoid supine sleep  No follow up needed with [facility] sleep clinic.     I certify that I have reviewed the entire raw data recording prior to the issuance of this report in accordance with the Standards of Accreditation of the American Academy of Sleep Medicine (AASM)   Larey Seat, MD Diplomat, American Board of Psychiatry and Neurology  Diplomat, American Board of Sleep Medicine Market researcher, [place] [facility] [employer]

## 2020-11-14 NOTE — Progress Notes (Signed)
No abnormal results, good sleep, clinically no relevant level of snoring or apnea, no PLMs and no hypoxia.   NO FOLLOW UP IN SLEEP CLINIC NEEDED> Patient can return to his primary Neurologist. He has already been followed by Dr Radford Pax, MD.

## 2020-11-19 DIAGNOSIS — M25511 Pain in right shoulder: Secondary | ICD-10-CM | POA: Diagnosis not present

## 2020-11-19 DIAGNOSIS — M25611 Stiffness of right shoulder, not elsewhere classified: Secondary | ICD-10-CM | POA: Diagnosis not present

## 2020-11-20 ENCOUNTER — Encounter: Payer: Self-pay | Admitting: Neurology

## 2020-11-22 DIAGNOSIS — M25611 Stiffness of right shoulder, not elsewhere classified: Secondary | ICD-10-CM | POA: Diagnosis not present

## 2020-11-22 DIAGNOSIS — M25511 Pain in right shoulder: Secondary | ICD-10-CM | POA: Diagnosis not present

## 2020-11-25 DIAGNOSIS — R1013 Epigastric pain: Secondary | ICD-10-CM | POA: Diagnosis not present

## 2020-11-26 DIAGNOSIS — M25511 Pain in right shoulder: Secondary | ICD-10-CM | POA: Diagnosis not present

## 2020-11-26 DIAGNOSIS — M25611 Stiffness of right shoulder, not elsewhere classified: Secondary | ICD-10-CM | POA: Diagnosis not present

## 2020-11-27 ENCOUNTER — Ambulatory Visit
Admission: RE | Admit: 2020-11-27 | Discharge: 2020-11-27 | Disposition: A | Discharge status: Home / Self Care | Payer: BC Managed Care – PPO | Source: Ambulatory Visit | Attending: Family Medicine | Admitting: Family Medicine

## 2020-11-27 ENCOUNTER — Other Ambulatory Visit: Payer: Self-pay | Admitting: Family Medicine

## 2020-11-27 ENCOUNTER — Other Ambulatory Visit: Payer: Self-pay

## 2020-11-27 DIAGNOSIS — R748 Abnormal levels of other serum enzymes: Secondary | ICD-10-CM

## 2020-11-27 DIAGNOSIS — R1013 Epigastric pain: Secondary | ICD-10-CM

## 2020-11-27 DIAGNOSIS — N281 Cyst of kidney, acquired: Secondary | ICD-10-CM | POA: Diagnosis not present

## 2020-11-27 DIAGNOSIS — I700 Atherosclerosis of aorta: Secondary | ICD-10-CM | POA: Diagnosis not present

## 2020-11-27 DIAGNOSIS — K769 Liver disease, unspecified: Secondary | ICD-10-CM | POA: Diagnosis not present

## 2020-11-27 DIAGNOSIS — M5127 Other intervertebral disc displacement, lumbosacral region: Secondary | ICD-10-CM | POA: Diagnosis not present

## 2020-11-27 MED ORDER — IOPAMIDOL (ISOVUE-300) INJECTION 61%
100.0000 mL | Freq: Once | INTRAVENOUS | Status: AC | PRN
  Administered 2020-11-27: 100 mL via INTRAVENOUS

## 2020-11-28 DIAGNOSIS — M25611 Stiffness of right shoulder, not elsewhere classified: Secondary | ICD-10-CM | POA: Diagnosis not present

## 2020-11-28 DIAGNOSIS — R748 Abnormal levels of other serum enzymes: Secondary | ICD-10-CM | POA: Diagnosis not present

## 2020-11-28 DIAGNOSIS — M25511 Pain in right shoulder: Secondary | ICD-10-CM | POA: Diagnosis not present

## 2020-12-02 DIAGNOSIS — M25511 Pain in right shoulder: Secondary | ICD-10-CM | POA: Diagnosis not present

## 2020-12-03 DIAGNOSIS — R109 Unspecified abdominal pain: Secondary | ICD-10-CM | POA: Diagnosis not present

## 2020-12-09 DIAGNOSIS — M25611 Stiffness of right shoulder, not elsewhere classified: Secondary | ICD-10-CM | POA: Diagnosis not present

## 2020-12-09 DIAGNOSIS — M25511 Pain in right shoulder: Secondary | ICD-10-CM | POA: Diagnosis not present

## 2020-12-10 DIAGNOSIS — R748 Abnormal levels of other serum enzymes: Secondary | ICD-10-CM | POA: Diagnosis not present

## 2020-12-10 DIAGNOSIS — Z125 Encounter for screening for malignant neoplasm of prostate: Secondary | ICD-10-CM | POA: Diagnosis not present

## 2020-12-10 DIAGNOSIS — Z0000 Encounter for general adult medical examination without abnormal findings: Secondary | ICD-10-CM | POA: Diagnosis not present

## 2020-12-10 DIAGNOSIS — E7800 Pure hypercholesterolemia, unspecified: Secondary | ICD-10-CM | POA: Diagnosis not present

## 2020-12-12 DIAGNOSIS — M25511 Pain in right shoulder: Secondary | ICD-10-CM | POA: Diagnosis not present

## 2020-12-12 DIAGNOSIS — M25611 Stiffness of right shoulder, not elsewhere classified: Secondary | ICD-10-CM | POA: Diagnosis not present

## 2020-12-16 DIAGNOSIS — K2930 Chronic superficial gastritis without bleeding: Secondary | ICD-10-CM | POA: Diagnosis not present

## 2020-12-16 DIAGNOSIS — K219 Gastro-esophageal reflux disease without esophagitis: Secondary | ICD-10-CM | POA: Diagnosis not present

## 2020-12-16 DIAGNOSIS — K2289 Other specified disease of esophagus: Secondary | ICD-10-CM | POA: Diagnosis not present

## 2020-12-16 DIAGNOSIS — K269 Duodenal ulcer, unspecified as acute or chronic, without hemorrhage or perforation: Secondary | ICD-10-CM | POA: Diagnosis not present

## 2020-12-16 DIAGNOSIS — K2980 Duodenitis without bleeding: Secondary | ICD-10-CM | POA: Diagnosis not present

## 2020-12-16 DIAGNOSIS — K2970 Gastritis, unspecified, without bleeding: Secondary | ICD-10-CM | POA: Diagnosis not present

## 2020-12-16 DIAGNOSIS — R1013 Epigastric pain: Secondary | ICD-10-CM | POA: Diagnosis not present

## 2020-12-16 DIAGNOSIS — K449 Diaphragmatic hernia without obstruction or gangrene: Secondary | ICD-10-CM | POA: Diagnosis not present

## 2020-12-17 DIAGNOSIS — M25511 Pain in right shoulder: Secondary | ICD-10-CM | POA: Diagnosis not present

## 2020-12-17 DIAGNOSIS — M25611 Stiffness of right shoulder, not elsewhere classified: Secondary | ICD-10-CM | POA: Diagnosis not present

## 2020-12-19 DIAGNOSIS — M25511 Pain in right shoulder: Secondary | ICD-10-CM | POA: Diagnosis not present

## 2020-12-19 DIAGNOSIS — M25611 Stiffness of right shoulder, not elsewhere classified: Secondary | ICD-10-CM | POA: Diagnosis not present

## 2020-12-23 DIAGNOSIS — M25511 Pain in right shoulder: Secondary | ICD-10-CM | POA: Diagnosis not present

## 2020-12-23 DIAGNOSIS — M25611 Stiffness of right shoulder, not elsewhere classified: Secondary | ICD-10-CM | POA: Diagnosis not present

## 2020-12-26 DIAGNOSIS — M25511 Pain in right shoulder: Secondary | ICD-10-CM | POA: Diagnosis not present

## 2020-12-26 DIAGNOSIS — M25611 Stiffness of right shoulder, not elsewhere classified: Secondary | ICD-10-CM | POA: Diagnosis not present

## 2020-12-31 DIAGNOSIS — M25511 Pain in right shoulder: Secondary | ICD-10-CM | POA: Diagnosis not present

## 2020-12-31 DIAGNOSIS — M25611 Stiffness of right shoulder, not elsewhere classified: Secondary | ICD-10-CM | POA: Diagnosis not present

## 2021-01-02 DIAGNOSIS — M25611 Stiffness of right shoulder, not elsewhere classified: Secondary | ICD-10-CM | POA: Diagnosis not present

## 2021-01-02 DIAGNOSIS — M25511 Pain in right shoulder: Secondary | ICD-10-CM | POA: Diagnosis not present

## 2021-01-07 DIAGNOSIS — M25611 Stiffness of right shoulder, not elsewhere classified: Secondary | ICD-10-CM | POA: Diagnosis not present

## 2021-01-07 DIAGNOSIS — M25511 Pain in right shoulder: Secondary | ICD-10-CM | POA: Diagnosis not present

## 2021-01-09 DIAGNOSIS — M25511 Pain in right shoulder: Secondary | ICD-10-CM | POA: Diagnosis not present

## 2021-01-09 DIAGNOSIS — M25611 Stiffness of right shoulder, not elsewhere classified: Secondary | ICD-10-CM | POA: Diagnosis not present

## 2021-02-03 DIAGNOSIS — M25511 Pain in right shoulder: Secondary | ICD-10-CM | POA: Diagnosis not present

## 2021-02-27 ENCOUNTER — Other Ambulatory Visit: Payer: Self-pay

## 2021-02-27 ENCOUNTER — Ambulatory Visit (INDEPENDENT_AMBULATORY_CARE_PROVIDER_SITE_OTHER): Payer: BC Managed Care – PPO | Admitting: Internal Medicine

## 2021-02-27 ENCOUNTER — Encounter: Payer: Self-pay | Admitting: Internal Medicine

## 2021-02-27 VITALS — BP 100/62 | HR 58 | Ht 75.0 in | Wt 180.0 lb

## 2021-02-27 DIAGNOSIS — I480 Paroxysmal atrial fibrillation: Secondary | ICD-10-CM | POA: Diagnosis not present

## 2021-02-27 DIAGNOSIS — R109 Unspecified abdominal pain: Secondary | ICD-10-CM | POA: Diagnosis not present

## 2021-02-27 DIAGNOSIS — K269 Duodenal ulcer, unspecified as acute or chronic, without hemorrhage or perforation: Secondary | ICD-10-CM | POA: Diagnosis not present

## 2021-02-27 NOTE — Progress Notes (Signed)
HPI [name] returns today for followup. He is a pleasant middle aged man with a h/o PAF, who has been well controlled with flecainide. He does not have HTN. He has undergone shoulder surgery and despite rehab required joint steriod injection. He has developed a duodenal ulcer after increased dose of NSAIDs. He denies any symptoms of atrial fib. He has moderated his ETOH consumption. He has been able to work vigorously without any limitation.   No Known Allergies   Current Outpatient Medications  Medication Sig Dispense Refill   aspirin EC 81 MG tablet Take 81 mg by mouth daily.     flecainide (TAMBOCOR) 100 MG tablet TAKE 1 TABLET BY MOUTH TWICE A DAY 180 tablet 3   JUBLIA 10 % SOLN Apply topically daily.     loratadine (CLARITIN) 10 MG tablet Take by mouth.     mupirocin ointment (BACTROBAN) 2 % SMARTSIG:1 Application Topical 2-3 Times Daily     NON FORMULARY CPAP     famotidine (PEPCID) 40 MG tablet Take 40 mg by mouth at bedtime.     No current facility-administered medications for this visit.     Past Medical History:  Diagnosis Date   Allergic rhinitis    Epididymal cyst    h/o    H/O actinic keratosis    Treated sith Aldara periodically by Dr Ubaldo Glassing.   H/O ganglion cyst    in the left wrist that resolved.   Onychomycosis    treated w lamisil   OSA (obstructive sleep apnea)    mild w AHI 14/hr now on 11cm H2O   PAF (paroxysmal atrial fibrillation) (HCC)    w CHADS VASC score 0-on ASA    ROS:   All systems reviewed and negative except as noted in the HPI.   History reviewed. No pertinent surgical history.   Family History  Problem Relation Age of Onset   Parkinson's disease Mother    Heart attack Father    Heart disease Father    Arrhythmia Brother      Social History   Socioeconomic History   Marital status: Married    Spouse name: Not on file   Number of children: 2   Years of education: 16   Highest education level: Not on file   Occupational History   Occupation: [employer]   Tobacco Use   Smoking status: Never   Smokeless tobacco: Never  Vaping Use   Vaping Use: Never used  Substance and Sexual Activity   Alcohol use: Yes    Comment: occasionally   Drug use: No   Sexual activity: Not on file  Other Topics Concern   Not on file  Social History Narrative   Drinks 1 cup a caffeine a day    Social Determinants of Health   Financial Resource Strain: Not on file  Food Insecurity: Not on file  Transportation Needs: Not on file  Physical Activity: Not on file  Stress: Not on file  Social Connections: Not on file  Intimate Partner Violence: Not on file     BP 100/62    Pulse (!) 58    Ht 6\' 3"  (1.905 m)    Wt 180 lb (81.6 kg)    SpO2 98%    BMI 22.50 kg/m   Physical Exam:  Well appearing NAD HEENT: Unremarkable Neck:  No JVD, no thyromegally Lymphatics:  No adenopathy Back:  No CVA tenderness Lungs:  Clear with no wheezes HEART:  Regular rate  rhythm, no murmurs, no rubs, no clicks Abd:  soft, positive bowel sounds, no organomegally, no rebound, no guarding Ext:  2 plus pulses, no edema, no cyanosis, no clubbing Skin:  No rashes no nodules Neuro:  CN II through XII intact, motor grossly intact  EKG - sinus bradycardia  Assess/Plan:  PAF - his CHADS Vasc score is zero and he will continue flecainide.  Duodenal ulcer - he will continue H+ receptor blockade.   Carleene Overlie Tyresha Fede,MD

## 2021-02-27 NOTE — Patient Instructions (Addendum)
Medication Instructions:  Your physician recommends that you continue on your current medications as directed. Please refer to the Current Medication list given to you today.  Labwork: None ordered.  Testing/Procedures: None ordered.  Follow-Up: Your physician wants you to follow-up in: one year with [name], MD  You will receive a reminder letter in the mail two months in advance. If you don't receive a letter, please call our office to schedule the follow-up appointment.  Any Other Special Instructions Will Be Listed Below (If Applicable).  If you need a refill on your cardiac medications before your next appointment, please call your pharmacy.

## 2021-03-19 DIAGNOSIS — L821 Other seborrheic keratosis: Secondary | ICD-10-CM | POA: Diagnosis not present

## 2021-03-19 DIAGNOSIS — D225 Melanocytic nevi of trunk: Secondary | ICD-10-CM | POA: Diagnosis not present

## 2021-03-19 DIAGNOSIS — D2261 Melanocytic nevi of right upper limb, including shoulder: Secondary | ICD-10-CM | POA: Diagnosis not present

## 2021-03-19 DIAGNOSIS — L570 Actinic keratosis: Secondary | ICD-10-CM | POA: Diagnosis not present

## 2021-05-06 DIAGNOSIS — H25011 Cortical age-related cataract, right eye: Secondary | ICD-10-CM | POA: Diagnosis not present

## 2021-05-06 DIAGNOSIS — D3131 Benign neoplasm of right choroid: Secondary | ICD-10-CM | POA: Diagnosis not present

## 2021-05-23 DIAGNOSIS — M6740 Ganglion, unspecified site: Secondary | ICD-10-CM | POA: Diagnosis not present

## 2021-05-23 DIAGNOSIS — R259 Unspecified abnormal involuntary movements: Secondary | ICD-10-CM | POA: Diagnosis not present

## 2021-05-23 DIAGNOSIS — H60502 Unspecified acute noninfective otitis externa, left ear: Secondary | ICD-10-CM | POA: Diagnosis not present

## 2021-05-23 DIAGNOSIS — H00015 Hordeolum externum left lower eyelid: Secondary | ICD-10-CM | POA: Diagnosis not present

## 2021-06-17 DIAGNOSIS — M25511 Pain in right shoulder: Secondary | ICD-10-CM | POA: Diagnosis not present

## 2021-06-17 DIAGNOSIS — M5412 Radiculopathy, cervical region: Secondary | ICD-10-CM | POA: Diagnosis not present

## 2021-07-01 DIAGNOSIS — M5412 Radiculopathy, cervical region: Secondary | ICD-10-CM | POA: Diagnosis not present

## 2021-07-01 DIAGNOSIS — M25511 Pain in right shoulder: Secondary | ICD-10-CM | POA: Diagnosis not present

## 2021-07-02 DIAGNOSIS — M25511 Pain in right shoulder: Secondary | ICD-10-CM | POA: Diagnosis not present

## 2021-07-09 DIAGNOSIS — M5412 Radiculopathy, cervical region: Secondary | ICD-10-CM | POA: Diagnosis not present

## 2021-07-09 DIAGNOSIS — M25511 Pain in right shoulder: Secondary | ICD-10-CM | POA: Diagnosis not present

## 2021-07-11 DIAGNOSIS — M25511 Pain in right shoulder: Secondary | ICD-10-CM | POA: Diagnosis not present

## 2021-07-11 DIAGNOSIS — M5412 Radiculopathy, cervical region: Secondary | ICD-10-CM | POA: Diagnosis not present

## 2021-07-16 DIAGNOSIS — M5412 Radiculopathy, cervical region: Secondary | ICD-10-CM | POA: Diagnosis not present

## 2021-07-16 DIAGNOSIS — M25511 Pain in right shoulder: Secondary | ICD-10-CM | POA: Diagnosis not present

## 2021-07-17 DIAGNOSIS — H04302 Unspecified dacryocystitis of left lacrimal passage: Secondary | ICD-10-CM | POA: Diagnosis not present

## 2021-07-18 DIAGNOSIS — M25511 Pain in right shoulder: Secondary | ICD-10-CM | POA: Diagnosis not present

## 2021-07-18 DIAGNOSIS — M5412 Radiculopathy, cervical region: Secondary | ICD-10-CM | POA: Diagnosis not present

## 2021-07-21 DIAGNOSIS — M5412 Radiculopathy, cervical region: Secondary | ICD-10-CM | POA: Diagnosis not present

## 2021-07-21 DIAGNOSIS — M25511 Pain in right shoulder: Secondary | ICD-10-CM | POA: Diagnosis not present

## 2021-07-22 DIAGNOSIS — H04302 Unspecified dacryocystitis of left lacrimal passage: Secondary | ICD-10-CM | POA: Diagnosis not present

## 2021-07-23 DIAGNOSIS — M25511 Pain in right shoulder: Secondary | ICD-10-CM | POA: Diagnosis not present

## 2021-07-23 DIAGNOSIS — M5412 Radiculopathy, cervical region: Secondary | ICD-10-CM | POA: Diagnosis not present

## 2021-07-24 ENCOUNTER — Ambulatory Visit (INDEPENDENT_AMBULATORY_CARE_PROVIDER_SITE_OTHER): Payer: BC Managed Care – PPO | Admitting: Neurology

## 2021-07-24 ENCOUNTER — Encounter: Payer: Self-pay | Admitting: Neurology

## 2021-07-24 VITALS — BP 121/76 | HR 68 | Ht 75.0 in | Wt 185.5 lb

## 2021-07-24 DIAGNOSIS — G478 Other sleep disorders: Secondary | ICD-10-CM

## 2021-07-24 DIAGNOSIS — G4733 Obstructive sleep apnea (adult) (pediatric): Secondary | ICD-10-CM

## 2021-07-24 DIAGNOSIS — I480 Paroxysmal atrial fibrillation: Secondary | ICD-10-CM

## 2021-07-24 DIAGNOSIS — Z9989 Dependence on other enabling machines and devices: Secondary | ICD-10-CM

## 2021-07-24 NOTE — Progress Notes (Signed)
SLEEP MEDICINE CLINIC    Provider:  Larey Seat, MD  Primary Care Physician:  Antony Contras, MD [place] [address]     Referring Provider: Antony Contras, [place],  [place] [address]   Sleep provider ; Dr Golden Hurter, MD          Chief Complaint according to patient   Patient presents with:     New Patient (Initial Visit)           HISTORY OF PRESENT ILLNESS:  Shawn Fields is a 61 y.o. year old White or Caucasian male patient seen upon referral on  from primary neurologist Dr. Leonie Man, MD  for a sleep consultation on a patient that already is established with Dr. Jeanella Flattery clinic, followed for CPAP and for atrial fibrillation ( onset in 2013) .   RV 07/24/2021:  patient here with wife - this to discuss the sleep study that they feel is not reflecting their concerns-  First of all his wife witnessed many movement events in his sleep , upper body - not necessarily a regular , rhythmic pattern- but a myoclonic jerking.  Shoulder pain is also present. Right shoulder, he now sleep on the left, uses CPAP .  He has constant pain.  He lost his sense of smell-  we did perform a MOCA- 28/ 30 points , normal.    Older brother has atrial fibrillation, father died with a massive heart attack. That let to his sleep study,and the patient now presents with concerns of waking stiff, rigid. His mother a had akinetic PD. Right arm dominant, while his mother had been left handed. .    Chief concern according to patient : see above -  I have the pleasure of seeing Shawn Fields today, a right-handed White or Caucasian male with a known sleep disorder and actively followed for sleep by Golden Hurter, MD.  Here upon referral of Dr Leonie Man, MD -    He  has a past medical history of Allergic rhinitis, Epididymal cyst, H/O actinic keratosis, H/O ganglion cyst, Onychomycosis, OSA (obstructive sleep apnea), and PAF (paroxysmal atrial  fibrillation) ([place]). Type A , Chronically on flecainide, and on ASA. Became fatigued on Cardiazem, none now.    The patient had the first sleep study in the year 2013 at [facility] in lab-  PLMs arousals 3.3 /h,  with a result of an AHI ( Apnea Hypopnea index)  of 14/h, a RDI ( Respiratory Disturbance Index) of 20. No comment on CPAP titration about RLS, PLMs and PLMs in REM sleep. He reports he is now acting out dreams, kicking occassionally, This started 15 years ago already, no happens every month. He had shoulder surgery, and after that he would wake up tingling , electric shock sensation .     Family medical /sleep history: Sister with CPAP/ OSA, brother atrial fib, mother had RLS and PD> .    Social history:  Patient is working in [place].  and lives in a household with spouse . Has  age 49 and 52 years of children.  The patient currently works for himself.  Tobacco use/.  ETOH use / 4 / week, Caffeine intake in form of Coffee( 1 cup in AM) Tea ( at lunch).    Sleep habits are as follows: The patient's dinner time is between 6.30-8 PM. The patient goes to bed at 11.30 PM and continues to  sleep for 7-8 hours, wakes for 1-2 bathroom breaks, the first time at 3 AM.   The preferred sleep position is left side, with the support of 1-2 pillows. Dreams are reportedly frequent/vivid.  8  AM is the usual rise time. The patient wakes up spontaneously.  He is a CPAP user.  He reports feeling refreshed or restored in AM, no longer with symptoms such as dry mouth, morning headaches, and residual fatigue. Naps are not  taken .   Review of Systems: Out of a complete 14 system review, the patient complains of only the following symptoms, and all other reviewed systems are negative.:    Patient is on CPAP.   Has been kicking, twitching and acting out dreams.    How likely are you to doze in the following situations: 0 = not likely, 1 = slight chance, 2 = moderate chance, 3 = high chance    Sitting and Reading? Watching Television? Sitting inactive in a public place (theater or meeting)? As a passenger in a car for an hour without a break? Lying down in the afternoon when circumstances permit? Sitting and talking to someone? Sitting quietly after lunch without alcohol? In a car, while stopped for a few minutes in traffic?   Total = 2/ 24 points   FSS endorsed at 15/ 63 points.   Social History   Socioeconomic History   Marital status: Married    Spouse name: Caren Griffins   Number of children: 2   Years of education: 16   Highest education level: Bachelor's degree (e.g., BA, AB, BS)  Occupational History   Occupation: [employer]   Tobacco Use   Smoking status: Never   Smokeless tobacco: Never  Vaping Use   Vaping Use: Never used  Substance and Sexual Activity   Alcohol use: Yes    Comment: occasionally   Drug use: No   Sexual activity: Not on file  Other Topics Concern   Not on file  Social History Narrative   Lives at home w wife   Drinks 1 cup a caffeine a day    Left handed   Social Determinants of Health   Financial Resource Strain: Not on file  Food Insecurity: Not on file  Transportation Needs: Not on file  Physical Activity: Not on file  Stress: Not on file  Social Connections: Not on file    Family History  Problem Relation Age of Onset   Parkinson's disease Mother    Heart attack Father    Heart disease Father    Arrhythmia Brother     Past Medical History:  Diagnosis Date   Allergic rhinitis    Epididymal cyst    h/o    H/O actinic keratosis    Treated sith Aldara periodically by Dr Ubaldo Glassing.   H/O ganglion cyst    in the left wrist that resolved.   Onychomycosis    treated w lamisil   OSA (obstructive sleep apnea)    mild w AHI 14/hr now on 11cm H2O   PAF (paroxysmal atrial fibrillation) (HCC)    w CHADS VASC score 0-on ASA    History reviewed. No pertinent surgical history.   Current Outpatient Medications on File  Prior to Visit  Medication Sig Dispense Refill   aspirin EC 81 MG tablet Take 81 mg by mouth daily.     flecainide (TAMBOCOR) 100 MG tablet TAKE 1 TABLET BY MOUTH TWICE A DAY 180 tablet 3   JUBLIA 10 % SOLN Apply topically  daily.     loratadine (CLARITIN) 10 MG tablet Take by mouth.     Magnesium Gluconate 250 MG TABS Take 2 tablets by mouth daily.     moxifloxacin (VIGAMOX) 0.5 % ophthalmic solution Place 1 drop into the left eye 3 (three) times daily.     mupirocin ointment (BACTROBAN) 2 % SMARTSIG:1 Application Topical 2-3 Times Daily     NON FORMULARY CPAP     No current facility-administered medications on file prior to visit.    No Known Allergies  Physical exam:  Today's Vitals   07/24/21 1522  BP: 121/76  Pulse: 68  Weight: 185 lb 8 oz (84.1 kg)  Height: '6\' 3"'$  (1.905 m)   Body mass index is 23.19 kg/m.   Wt Readings from Last 3 Encounters:  07/24/21 185 lb 8 oz (84.1 kg)  02/27/21 180 lb (81.6 kg)  08/30/20 185 lb 9.6 oz (84.2 kg)     Ht Readings from Last 3 Encounters:  07/24/21 '6\' 3"'$  (1.905 m)  02/27/21 '6\' 3"'$  (1.905 m)  08/30/20 '6\' 3"'$  (1.905 m)      General: The patient is awake, alert and appears not in acute distress. The patient is well groomed. Head: Normocephalic, atraumatic. Neck is supple. Mallampati 2,  neck circumference:15 inches . Nasal airflow  patent.  Retrognathia is mild.  Dental status:  Cardiovascular:  Regular rate and cardiac rhythm by pulse,  without distended neck veins. Respiratory: Lungs are clear to auscultation.  Skin:  Without evidence of ankle edema, or rash. Trunk: The patient's posture is erect.   Neurologic exam : The patient is awake and alert, oriented to place and time.   Memory subjective described as intact.     07/24/2021    3:39 PM  Montreal Cognitive Assessment   Visuospatial/ Executive (0/5) 5  Naming (0/3) 3  Attention: Read list of digits (0/2) 2  Attention: Read list of letters (0/1) 1  Attention: Serial 7  subtraction starting at 100 (0/3) 3  Language: Repeat phrase (0/2) 2  Language : Fluency (0/1) 1  Abstraction (0/2) 2  Delayed Recall (0/5) 3  Orientation (0/6) 6  Total 28    Attention span & concentration ability appears normal.  Speech is fluent,  without  dysarthria, dysphonia or aphasia.  Mood and affect are appropriate.   Cranial nerves: no loss of smell or taste reported   UNMASKED FACE- no facial mimic loss.  Pupils are equal and briskly reactive to light. Funduscopic exam deferred...  Extraocular movements in vertical and horizontal planes were intact and without nystagmus.  Saccades to the left were more coarse.  No Diplopia. Visual fields by finger perimetry are intact. Hearing was intact to soft voice and finger rubbing.    Facial sensation intact to fine touch.  Facial motor strength- tongue and uvula move midline. Ptosis on the right eye.  Neck ROM : rotation, tilt and flexion extension were normal for age and shoulder shrug was symmetrical.    Motor exam:  Symmetric bulk, tone and ROM.   ELEVATED Tone with cog wheeling, bilateral biceps and wrist , asymmetric grip strength . Right with tremor- left side without.  He walked without loss of arm swing, no pill rolling tremor- his right arm is in pain and he keeps the shoulder elevated- protective. ROM overhead is limited due to shoulder injury.     Sensory:  Fine touch, pinprick and vibration were tested  and  normal.  Proprioception tested in the upper extremities  was normal.   Coordination: Rapid alternating movements in the fingers/hands were of normal speed.  Mild tremor at rest with outstretched arms.  The Finger-to-nose maneuver was intact without evidence of ataxia, dysmetria but with satellite tremor.    Gait and station: Patient could rise unassisted from a seated position, walked without assistive device.  Stance is of normal width/ base and the patient turned with 3 steps.  Toe and heel walk were deferred.   Deep tendon reflexes: in the  upper and lower extremities are symmetric and 2 plus. Babinski response was deferred .        After spending a total time of  45 minutes face to face and additional time for physical and neurologic examination, review of laboratory studies,  personal review of imaging studies, reports and results of other testing and review of referral information / records as far as provided in visit, I have established the following assessments:  1) PLMs were present in first sleep study almost a decade ago. Now in a repeat study he had none- PLMs are defined , NO RLS !!! 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  Reports visual misperceptions, no falls, no visual hallucinations. [facility] Memory by [facility] 3) shoulder pain accounts for protective arm flexion and loss of arm swing when playing pickle ball.    My Plan is to proceed with:  1) REM sleep behavior. ; I will offer a Klonopin prescription prn, in case he needs it.   Magnesium prn. 2) RV with primary neurologist once a year for screening for any PD developments.  3) have your shoulder treated !   4) I wanted the patient to continue CPAP With Dr Golden Hurter, MD.  Patient asked for transfer of CPAP care . I reviewed his excellent compliance 100% -  Feels CPAP dependent Has no hypoxia.  Has used nasal pillows. Travel CPAP to be set at 11 cm water.    I would like to thank , MD and Antony Contras, [place],  [place] [id number] for allowing me to meet with and to take care of this pleasant patient.   In short, EVARISTO TSUDA is presenting with questions of  parkinsonism, of REM BD.  He is a primary patient of Dr Leonie Man, MD, to be seen within 12 months. Consider DAT scan if symptoms  progress.    Electronically signed by: Larey Seat, MD 07/24/2021 3:51 PM  [facility] Sleep Board certified by The [employer] of  Sleep Medicine and Diplomate of the [employer] of Sleep Medicine. Board certified In Neurology through the [place], Fellow of the [employer] of Neurology. Medical Director of [employer].

## 2021-07-29 ENCOUNTER — Telehealth: Payer: Self-pay | Admitting: Neurology

## 2021-07-29 DIAGNOSIS — H04302 Unspecified dacryocystitis of left lacrimal passage: Secondary | ICD-10-CM | POA: Diagnosis not present

## 2021-07-29 DIAGNOSIS — M5412 Radiculopathy, cervical region: Secondary | ICD-10-CM | POA: Diagnosis not present

## 2021-07-29 DIAGNOSIS — M25511 Pain in right shoulder: Secondary | ICD-10-CM | POA: Diagnosis not present

## 2021-07-29 NOTE — Telephone Encounter (Signed)
At 11:22 pt left a vm stating he was told Dr Brett Fairy was sending a script for a new CPA to DME.  Pt states he has reached out to [facility] and they have not received anything, pt is asking for a call to discuss.

## 2021-07-29 NOTE — Telephone Encounter (Addendum)
Called pt. Informed him order sent to [facility]. Message sent to [facility] and [employer]. He verbalized understanding and appreciation.

## 2021-07-29 NOTE — Telephone Encounter (Signed)
Called pt back. Let him know [facility] messaged back and they do not carry travel cpap machines. He will need to go via sleepdirect.com or cpap.com to get travel cpap and pay OOP. Not covered by insurance. He verbalized understanding. I emailed him the order at [email or web address]. Sent mychart with link to [email or web address] website.

## 2021-07-30 ENCOUNTER — Other Ambulatory Visit: Payer: Self-pay | Admitting: Neurology

## 2021-07-30 DIAGNOSIS — G4733 Obstructive sleep apnea (adult) (pediatric): Secondary | ICD-10-CM

## 2021-07-30 NOTE — Telephone Encounter (Signed)
Took call from phone staff and spoke w/ pt. He is currently at [facility] and told he can buy travel cpap OOP there. Just need updated order. He states they also checked current pressure of machine he has. It is 7cm. Wondering if he can keep this since doing well instead of changing to 11cm. Aware he can. New order placed.

## 2021-08-01 DIAGNOSIS — M5412 Radiculopathy, cervical region: Secondary | ICD-10-CM | POA: Diagnosis not present

## 2021-08-01 DIAGNOSIS — M25511 Pain in right shoulder: Secondary | ICD-10-CM | POA: Diagnosis not present

## 2021-08-05 DIAGNOSIS — M25511 Pain in right shoulder: Secondary | ICD-10-CM | POA: Diagnosis not present

## 2021-08-05 DIAGNOSIS — M5412 Radiculopathy, cervical region: Secondary | ICD-10-CM | POA: Diagnosis not present

## 2021-08-07 DIAGNOSIS — M25511 Pain in right shoulder: Secondary | ICD-10-CM | POA: Diagnosis not present

## 2021-08-07 DIAGNOSIS — M5412 Radiculopathy, cervical region: Secondary | ICD-10-CM | POA: Diagnosis not present

## 2021-08-08 DIAGNOSIS — H04302 Unspecified dacryocystitis of left lacrimal passage: Secondary | ICD-10-CM | POA: Diagnosis not present

## 2021-08-11 DIAGNOSIS — M5412 Radiculopathy, cervical region: Secondary | ICD-10-CM | POA: Diagnosis not present

## 2021-08-11 DIAGNOSIS — M25511 Pain in right shoulder: Secondary | ICD-10-CM | POA: Diagnosis not present

## 2021-08-13 DIAGNOSIS — M5412 Radiculopathy, cervical region: Secondary | ICD-10-CM | POA: Diagnosis not present

## 2021-08-13 DIAGNOSIS — M25511 Pain in right shoulder: Secondary | ICD-10-CM | POA: Diagnosis not present

## 2021-08-19 DIAGNOSIS — H04302 Unspecified dacryocystitis of left lacrimal passage: Secondary | ICD-10-CM | POA: Diagnosis not present

## 2021-09-01 DIAGNOSIS — M25511 Pain in right shoulder: Secondary | ICD-10-CM | POA: Diagnosis not present

## 2021-09-01 DIAGNOSIS — M542 Cervicalgia: Secondary | ICD-10-CM | POA: Diagnosis not present

## 2021-09-30 DIAGNOSIS — K649 Unspecified hemorrhoids: Secondary | ICD-10-CM | POA: Diagnosis not present

## 2021-09-30 DIAGNOSIS — Z1211 Encounter for screening for malignant neoplasm of colon: Secondary | ICD-10-CM | POA: Diagnosis not present

## 2021-09-30 DIAGNOSIS — K635 Polyp of colon: Secondary | ICD-10-CM | POA: Diagnosis not present

## 2021-10-02 DIAGNOSIS — H04322 Acute dacryocystitis of left lacrimal passage: Secondary | ICD-10-CM | POA: Diagnosis not present

## 2021-10-03 ENCOUNTER — Other Ambulatory Visit: Payer: Self-pay | Admitting: Internal Medicine

## 2021-12-23 DIAGNOSIS — U071 COVID-19: Secondary | ICD-10-CM | POA: Diagnosis not present

## 2021-12-31 DIAGNOSIS — L570 Actinic keratosis: Secondary | ICD-10-CM | POA: Diagnosis not present

## 2021-12-31 DIAGNOSIS — D0462 Carcinoma in situ of skin of left upper limb, including shoulder: Secondary | ICD-10-CM | POA: Diagnosis not present

## 2022-01-22 DIAGNOSIS — E7800 Pure hypercholesterolemia, unspecified: Secondary | ICD-10-CM | POA: Diagnosis not present

## 2022-01-22 DIAGNOSIS — Z0000 Encounter for general adult medical examination without abnormal findings: Secondary | ICD-10-CM | POA: Diagnosis not present

## 2022-01-22 DIAGNOSIS — K4090 Unilateral inguinal hernia, without obstruction or gangrene, not specified as recurrent: Secondary | ICD-10-CM | POA: Diagnosis not present

## 2022-01-22 DIAGNOSIS — G4733 Obstructive sleep apnea (adult) (pediatric): Secondary | ICD-10-CM | POA: Diagnosis not present

## 2022-01-22 DIAGNOSIS — Z125 Encounter for screening for malignant neoplasm of prostate: Secondary | ICD-10-CM | POA: Diagnosis not present

## 2022-01-22 DIAGNOSIS — L989 Disorder of the skin and subcutaneous tissue, unspecified: Secondary | ICD-10-CM | POA: Diagnosis not present

## 2022-02-09 DIAGNOSIS — K4090 Unilateral inguinal hernia, without obstruction or gangrene, not specified as recurrent: Secondary | ICD-10-CM | POA: Diagnosis not present

## 2022-03-10 ENCOUNTER — Telehealth: Payer: Self-pay | Admitting: Internal Medicine

## 2022-03-10 NOTE — Telephone Encounter (Signed)
Patient stated on 03/01/22 while skiing he became symptomatic and took an extra flecainide. He is currently asymptomatic. Pt will like to know if he needs an office visit due to having an episode. Will forward to MD and nurse.

## 2022-03-10 NOTE — Telephone Encounter (Signed)
Patient c/o Palpitations:  High priority if patient c/o lightheadedness, shortness of breath, or chest pain  How long have you had palpitations/irregular HR/ Afib? Are you having the symptoms now? No  Are you currently experiencing lightheadedness, SOB or CP?   No. Had episode last week which lasted for 6 hours  Do you have a history of afib (atrial fibrillation) or irregular heart rhythm?   Yes  Have you checked your BP or HR? (document readings if available):   Not currently.  Patient stated HR went up as high as 188 last week  Are you experiencing any other symptoms?   Very lightheaded   Patient stated he was on a skiing trip last week and wanted to report that he had to take an extra dose of flecainide (TAMBOCOR) 100 MG tablet during the episode and would like to know next steps.

## 2022-03-11 NOTE — Telephone Encounter (Signed)
F/u appointment scheduled with [name] on 03/18/22 at 840 am.

## 2022-03-11 NOTE — Telephone Encounter (Signed)
Pt called per message received from [facility] triage RN.   Dr. Lovena Le briefed on the extra Flecainide the Pt took on 03/01/2022, due to being symptomatic and having a HR of 170-180 bpm.  Pt stated he rested after the extra flecainide dose about 5 hours.  His HR settled down, and returned to normal.    Pt would like and appointment with an EP APP or to see Dr. Lovena Le prior to 03/22/2022.  The Pt is leaving the country and has business in [place].  He is concerned about the event that occurred.  I will send a message to scheduling.

## 2022-03-17 NOTE — Progress Notes (Unsigned)
  Cardiology Office Note:   Date:  03/18/2022  ID:  Shawn Fields, DOB Mar 25, 1960, MRN [id number]  Primary Cardiologist: None Electrophysiologist: [name], MD   History of Present Illness:   Shawn Fields is a 62 y.o. male with PAF and OSA seen today for routine electrophysiology followup. Since last being seen in our clinic the patient reports doing OK overall.    He has had two episodes of AF. One in October that lasted several hours, and ultimately resolved with an additional flecainide dose. There were no other extenuating factors other than exercise for this episode.  Last month, he was skiing [place] an [place] of 54, as well as having several drinks when he again went out of rhythm. He took an additional flecainide again with improvement after several hours. He is interested in being considered for ablation. He is not currently on anticoagulation. Otherwise, he denies chest pain, dyspnea, PND, orthopnea, nausea, vomiting, dizziness, syncope, edema, weight gain, or early satiety.   Review of systems complete and found to be negative unless listed in HPI.    Studies Reviewed:    EKG is ordered today. Personal review shows NSR at 62 bpm  Risk Assessment/Calculations:           Physical Exam:   VS:  BP 100/66   Pulse 62   Ht '6\' 3"'$  (1.905 m)   Wt 183 lb 6.4 oz (83.2 kg)   SpO2 98%   BMI 22.92 kg/m    Wt Readings from Last 3 Encounters:  03/18/22 183 lb 6.4 oz (83.2 kg)  07/24/21 185 lb 8 oz (84.1 kg)  02/27/21 180 lb (81.6 kg)     GEN: Well nourished, well developed in no acute distress NECK: No JVD; No carotid bruits CARDIAC: RRR, no murmurs, rubs, gallops RESPIRATORY:  Clear to auscultation without rales, wheezing or rhonchi  ABDOMEN: Soft, non-tender, non-distended EXTREMITIES:  No edema; No deformity   ASSESSMENT AND PLAN:   Paroxysmal atrial fibrillation EKG today shows NSR at 62 bpm with stable intervals Continue flecainide 100 mg BID.  Offered to increase  flecainide, but Sunday he goes out of the country for 2 weeks and wishes not to make changes.  Offered Tikosyn consideration but not amenable to admission at this time.  Would avoid amio with multiple other options and his young age.  He is not currently anticoagulated with no breakthrough AF and CHA2DS2VASc  of 0. He understands he will need anticoagulation for ablation consideration and would like to see Dr. Curt Bears   OSA  Encouraged nightly CPAP     Follow up with Dr. Curt Bears to discuss ablation. Dr. Lovena Le agrees with plan.   Signed, Shirley Friar, PA-C

## 2022-03-18 ENCOUNTER — Encounter: Payer: Self-pay | Admitting: Student

## 2022-03-18 ENCOUNTER — Ambulatory Visit: Payer: BC Managed Care – PPO | Attending: Student | Admitting: Student

## 2022-03-18 VITALS — BP 100/66 | HR 62 | Ht 75.0 in | Wt 183.4 lb

## 2022-03-18 DIAGNOSIS — G4733 Obstructive sleep apnea (adult) (pediatric): Secondary | ICD-10-CM | POA: Diagnosis not present

## 2022-03-18 DIAGNOSIS — I480 Paroxysmal atrial fibrillation: Secondary | ICD-10-CM

## 2022-03-18 NOTE — Patient Instructions (Signed)
Medication Instructions:  Your physician recommends that you continue on your current medications as directed. Please refer to the Current Medication list given to you today.  *If you need a refill on your cardiac medications before your next appointment, please call your pharmacy*   Lab Work: BMET and CBC today If you have labs (blood work) drawn today and your tests are completely normal, you will receive your results only by: [place] (if you have MyChart) OR A paper copy in the mail If you have any lab test that is abnormal or we need to change your treatment, we will call you to review the results.   Follow-Up: At [facility], you and your health needs are our priority.  As part of our continuing mission to provide you with exceptional heart care, we have created designated Provider Care Teams.  These Care Teams include your primary Cardiologist (physician) and Advanced Practice Providers (APPs -  Physician Assistants and Nurse Practitioners) who all work together to provide you with the care you need, when you need it.  Your next appointment:   04/15/2022 at 3:45 PM  Provider:   Allegra Lai, MD

## 2022-03-19 LAB — CBC
Hematocrit: 43 % (ref 37.5–51.0)
Hemoglobin: 14.7 g/dL (ref 13.0–17.7)
MCH: 31.2 pg (ref 26.6–33.0)
MCHC: 34.2 g/dL (ref 31.5–35.7)
MCV: 91 fL (ref 79–97)
Platelets: 297 10*3/uL (ref 150–450)
RBC: 4.71 x10E6/uL (ref 4.14–5.80)
RDW: 12.8 % (ref 11.6–15.4)
WBC: 4.8 10*3/uL (ref 3.4–10.8)

## 2022-03-19 LAB — BASIC METABOLIC PANEL
BUN/Creatinine Ratio: 16 (ref 10–24)
BUN: 17 mg/dL (ref 8–27)
CO2: 24 mmol/L (ref 20–29)
Calcium: 9.2 mg/dL (ref 8.6–10.2)
Chloride: 104 mmol/L (ref 96–106)
Creatinine, Ser: 1.07 mg/dL (ref 0.76–1.27)
Glucose: 88 mg/dL (ref 70–99)
Potassium: 4.4 mmol/L (ref 3.5–5.2)
Sodium: 140 mmol/L (ref 134–144)
eGFR: 79 mL/min/{1.73_m2} (ref >59)

## 2022-04-09 DIAGNOSIS — D2262 Melanocytic nevi of left upper limb, including shoulder: Secondary | ICD-10-CM | POA: Diagnosis not present

## 2022-04-09 DIAGNOSIS — L820 Inflamed seborrheic keratosis: Secondary | ICD-10-CM | POA: Diagnosis not present

## 2022-04-09 DIAGNOSIS — L570 Actinic keratosis: Secondary | ICD-10-CM | POA: Diagnosis not present

## 2022-04-09 DIAGNOSIS — Z85828 Personal history of other malignant neoplasm of skin: Secondary | ICD-10-CM | POA: Diagnosis not present

## 2022-04-09 DIAGNOSIS — L821 Other seborrheic keratosis: Secondary | ICD-10-CM | POA: Diagnosis not present

## 2022-04-09 DIAGNOSIS — D225 Melanocytic nevi of trunk: Secondary | ICD-10-CM | POA: Diagnosis not present

## 2022-04-15 ENCOUNTER — Ambulatory Visit: Payer: BC Managed Care – PPO | Attending: Cardiology | Admitting: Cardiology

## 2022-04-15 ENCOUNTER — Encounter: Payer: Self-pay | Admitting: Cardiology

## 2022-04-15 ENCOUNTER — Other Ambulatory Visit: Payer: Self-pay

## 2022-04-15 ENCOUNTER — Encounter (HOSPITAL_BASED_OUTPATIENT_CLINIC_OR_DEPARTMENT_OTHER): Payer: Self-pay | Admitting: Surgery

## 2022-04-15 VITALS — BP 118/80 | HR 62 | Ht 75.0 in | Wt 185.0 lb

## 2022-04-15 DIAGNOSIS — G4733 Obstructive sleep apnea (adult) (pediatric): Secondary | ICD-10-CM | POA: Diagnosis not present

## 2022-04-15 DIAGNOSIS — I480 Paroxysmal atrial fibrillation: Secondary | ICD-10-CM | POA: Diagnosis not present

## 2022-04-15 NOTE — Progress Notes (Signed)
Electrophysiology Office Note   Date:  04/15/2022   ID:  Shawn Fields, Shawn Fields 12-Dec-1960, MRN [id number]  PCP:  Shawn Contras, MD  Cardiologist:   Primary Electrophysiologist: Shawn Alken, MD    Chief Complaint: AF   History of Present Illness: Shawn Fields is a 62 y.o. male who is being seen today for the evaluation of AF at the request of Shawn Contras, MD. Presenting today for electrophysiology evaluation.  He has a history significant for sleep apnea and atrial fibrillation.  He has atrial fibrillation for the last 10 years and has been on flecainide.  2 times this year, he has had episodes of atrial fibrillation.  1 of which he was playing pickle ball and another he was skiing.  Each episode lasted for 6 or 7 hours with significantly rapid heart rates.  He felt quite fatigued and short of breath during each episode.  The episode he was [place] and went into atrial fibrillation.  He gets tunnel vision when he is in atrial fibrillation.  He takes an extra dose of flecainide which eventually has converted him to sinus rhythm.  Today, he denies symptoms of palpitations, chest pain, shortness of breath, orthopnea, PND, lower extremity edema, claudication, dizziness, presyncope, syncope, bleeding, or neurologic sequela. The patient is tolerating medications without difficulties.    Past Medical History:  Diagnosis Date   Allergic rhinitis    Epididymal cyst    h/o    H/O actinic keratosis    Treated sith [place] periodically by Dr Ubaldo Glassing.   H/O ganglion cyst    in the left wrist that resolved.   Onychomycosis    treated w lamisil   OSA (obstructive sleep apnea)    mild w AHI 14/hr now on 11cm H2O   PAF (paroxysmal atrial fibrillation)    w CHADS VASC score 0-on ASA   Past Surgical History:  Procedure Laterality Date   HERNIA REPAIR       Current Outpatient Medications  Medication Sig Dispense Refill   aspirin EC 81 MG  tablet Take 81 mg by mouth daily.     flecainide (TAMBOCOR) 100 MG tablet TAKE 1 TABLET BY MOUTH TWICE A DAY 180 tablet 2   JUBLIA 10 % SOLN Apply topically daily.     NON FORMULARY CPAP     No current facility-administered medications for this visit.    Allergies:   Patient has no known allergies.   Social History:  The patient  reports that he has never smoked. He has never used smokeless tobacco. He reports current alcohol use. He reports that he does not use drugs.   Family History:  The patient's family history includes Arrhythmia in his brother; Heart attack in his father; Heart disease in his father; Parkinson's disease in his mother.    ROS:  Please see the history of present illness.   Otherwise, review of systems is positive for none.   All other systems are reviewed and negative.    PHYSICAL EXAM: VS:  BP 118/80   Pulse 62   Ht 6\' 3"  (1.905 m)   Wt 185 lb (83.9 kg)   SpO2 98%   BMI 23.12 kg/m  , BMI Body mass index is 23.12 kg/m. GEN: Well nourished, well developed, in no acute distress  HEENT: normal  Neck: no JVD, carotid bruits, or masses Cardiac: RRR; no murmurs, rubs, or gallops,no edema  Respiratory:  clear to auscultation  bilaterally, normal work of breathing GI: soft, nontender, nondistended, + BS MS: no deformity or atrophy  Skin: warm and dry Neuro:  Strength and sensation are intact Psych: euthymic mood, full affect  EKG:  EKG is not ordered today. Personal review of the ekg ordered 03/18/22 shows sinus rhythm  Recent Labs: 03/18/2022: BUN 17; Creatinine, Ser 1.07; Hemoglobin 14.7; Platelets 297; Potassium 4.4; Sodium 140    Lipid Panel     Component Value Date/Time   CHOL 164 06/11/2016 0907   TRIG 110 06/11/2016 0907   HDL 48 06/11/2016 0907   CHOLHDL 3.4 06/11/2016 0907   LDLCALC 94 06/11/2016 0907     Wt Readings from Last 3 Encounters:  04/15/22 185 lb (83.9 kg)  03/18/22 183 lb 6.4 oz (83.2 kg)  07/24/21 185 lb 8 oz (84.1 kg)       Other studies Reviewed: Additional studies/ records that were reviewed today include: TTE 10/27/17  Review of the above records today demonstrates:  - Left ventricle: The cavity size was normal. Wall thickness was    normal. The estimated ejection fraction was 55%. Wall motion was    normal; there were no regional wall motion abnormalities.    Features are consistent with a pseudonormal left ventricular    filling pattern, with concomitant abnormal relaxation and    increased filling pressure (grade 2 diastolic dysfunction).  - Aortic valve: There was no stenosis.  - Mitral valve: There was trivial regurgitation.  - Right ventricle: The cavity size was normal. Systolic function    was normal.  - Pulmonary arteries: No complete TR doppler jet so unable to    estimate PA systolic pressure.  - Inferior vena cava: The vessel was normal in size. The    respirophasic diameter changes were in the normal range (>= 50%),    consistent with normal central venous pressure.    ASSESSMENT AND PLAN:  1.  Paroxysmal atrial fibrillation: Currently on flecainide.  He has had more frequent episodes of atrial fibrillation and would like an alternative rhythm control strategy.  He would potentially like to get off of flecainide.  Due to that, we Shawn Fields plan for ablation.  Risk and benefits have been discussed.  He understands the risks and is agreed to the procedure.  Shawn Fields start him on Eliquis 3 weeks prior to ablation.  2.  Obstructive sleep apnea: CPAP compliance encouraged    Current medicines are reviewed at length with the patient today.   The patient does not have concerns regarding his medicines.  The following changes were made today:  none  Labs/ tests ordered today include:  No orders of the defined types were placed in this encounter.    Disposition:   FU with Shawn Fields 3 months  Signed, Shawn Masso Meredith Leeds, MD  04/15/2022 4:55 PM     [address] [place] [address] [place] [address] [phone] (office) [phone] (fax)

## 2022-04-15 NOTE — Progress Notes (Signed)
Patient's chart and cardiac notes reviewed with Dr Gifford Shave, [place] for [facility].

## 2022-04-15 NOTE — Progress Notes (Signed)
   04/15/22 1652  PAT Phone Screen  Is the patient taking a GLP-1 receptor agonist? No  Do You Have Diabetes? No  Do You Have Hypertension? No  Have You Ever Been to the ER for Asthma? No  Have You Taken Oral Steroids in the Past 3 Months? No  Do you Take Phenteramine or any Other Diet Drugs? No  Recent  Lab Work, EKG, CXR? Yes  Where was this test performed? 03-18-22 EKG, CBC, BMP  Do you have a history of heart problems? Yes  Cardiologist Name Lamonte Sakai for PAF  Have you ever had tests on your heart? Yes  What cardiac tests were performed? Echo  What date/year were cardiac tests completed? 10-27-17 ECHO EF 55%  Results viewable: [facility] Media Tab  Any Recent Hospitalizations? No  Height 6\' 3"  (1.905 m)  Weight 83.9 kg  Pat Appointment Scheduled No  Reason for No Appointment Not Needed

## 2022-04-15 NOTE — Patient Instructions (Signed)
Medication Instructions:  Your physician recommends that you continue on your current medications as directed. Please refer to the Current Medication list given to you today.   *If you need a refill on your cardiac medications before your next appointment, please call your pharmacy*   Lab Work: 1 week prior to CT scan: CBC, BMET (you do not need to be fasting) You may come to the lab any time between 7:30am and 4:30pm. If you have labs (blood work) drawn today and your tests are completely normal, you will receive your results only by: [place] (if you have MyChart) OR A paper copy in the mail If you have any lab test that is abnormal or we need to change your treatment, we will call you to review the results.   Testing/Procedures: Your physician has requested that you have cardiac CT. Cardiac computed tomography (CT) is a painless test that uses an x-ray machine to take clear, detailed pictures of your heart. For further information please visit [email or web address]. Please follow instruction sheet as given.   Your physician has recommended that you have an ablation. Catheter ablation is a medical procedure used to treat some cardiac arrhythmias (irregular heartbeats). During catheter ablation, a long, thin, flexible tube is put into a blood vessel in your groin (upper thigh), or neck. This tube is called an ablation catheter. It is then guided to your heart through the blood vessel. Radio frequency waves destroy small areas of heart tissue where abnormal heartbeats may cause an arrhythmia to start.    The EP procedure scheduler, April G, will call you to schedule your procedure and review instructions with you. Please allow 2-4 weeks for her to contact you.  We are planning to schedule your ablation for Friday August 14, 2022 with Dr. Allegra Lai. We will call you to confirm the date and time and review instructions with you.     Follow-Up: At [facility] [place], you and your  health needs are our priority.  As part of our continuing mission to provide you with exceptional heart care, we have created designated Provider Care Teams.  These Care Teams include your primary Cardiologist (physician) and Advanced Practice Providers (APPs -  Physician Assistants and Nurse Practitioners) who all work together to provide you with the care you need, when you need it.   Your next appointment:   4 week(s) after your ablation   Provider:   You will follow up in the [facility] located at [facility]. Your provider will be: Clint R. Fenton, PA-C

## 2022-04-21 ENCOUNTER — Ambulatory Visit: Payer: Self-pay | Admitting: Surgery

## 2022-04-22 ENCOUNTER — Other Ambulatory Visit: Payer: Self-pay

## 2022-04-22 ENCOUNTER — Ambulatory Visit (HOSPITAL_BASED_OUTPATIENT_CLINIC_OR_DEPARTMENT_OTHER): Payer: BC Managed Care – PPO | Admitting: Anesthesiology

## 2022-04-22 ENCOUNTER — Ambulatory Visit (HOSPITAL_BASED_OUTPATIENT_CLINIC_OR_DEPARTMENT_OTHER)
Admission: RE | Admit: 2022-04-22 | Discharge: 2022-04-22 | Disposition: A | Discharge status: Home / Self Care | Payer: BC Managed Care – PPO | Attending: Surgery | Admitting: Surgery

## 2022-04-22 ENCOUNTER — Encounter (HOSPITAL_BASED_OUTPATIENT_CLINIC_OR_DEPARTMENT_OTHER): Payer: Self-pay | Admitting: Surgery

## 2022-04-22 ENCOUNTER — Encounter (HOSPITAL_BASED_OUTPATIENT_CLINIC_OR_DEPARTMENT_OTHER)
Admission: RE | Disposition: A | Discharge status: Home / Self Care | Payer: Self-pay | Source: Home / Self Care | Attending: Surgery

## 2022-04-22 DIAGNOSIS — Z01818 Encounter for other preprocedural examination: Secondary | ICD-10-CM

## 2022-04-22 DIAGNOSIS — K4090 Unilateral inguinal hernia, without obstruction or gangrene, not specified as recurrent: Secondary | ICD-10-CM | POA: Diagnosis not present

## 2022-04-22 DIAGNOSIS — G4730 Sleep apnea, unspecified: Secondary | ICD-10-CM | POA: Diagnosis not present

## 2022-04-22 DIAGNOSIS — I4891 Unspecified atrial fibrillation: Secondary | ICD-10-CM | POA: Insufficient documentation

## 2022-04-22 DIAGNOSIS — G8918 Other acute postprocedural pain: Secondary | ICD-10-CM | POA: Diagnosis not present

## 2022-04-22 DIAGNOSIS — Z9989 Dependence on other enabling machines and devices: Secondary | ICD-10-CM | POA: Diagnosis not present

## 2022-04-22 DIAGNOSIS — G4733 Obstructive sleep apnea (adult) (pediatric): Secondary | ICD-10-CM | POA: Diagnosis not present

## 2022-04-22 HISTORY — PX: INGUINAL HERNIA REPAIR: SHX194

## 2022-04-22 SURGERY — REPAIR, HERNIA, INGUINAL, ADULT
Anesthesia: General | Site: Groin | Laterality: Right

## 2022-04-22 MED ORDER — FENTANYL CITRATE (PF) 100 MCG/2ML IJ SOLN
INTRAMUSCULAR | Status: AC
  Filled 2022-04-22: qty 2

## 2022-04-22 MED ORDER — OXYCODONE HCL 5 MG/5ML PO SOLN: 5.0000 mg | Freq: Once | ORAL | Status: AC | PRN

## 2022-04-22 MED ORDER — KETOROLAC TROMETHAMINE 30 MG/ML IJ SOLN
30.0000 mg | Freq: Once | INTRAMUSCULAR | Status: AC
  Administered 2022-04-22: 30 mg via INTRAVENOUS

## 2022-04-22 MED ORDER — BUPIVACAINE HCL (PF) 0.25 % IJ SOLN
INTRAMUSCULAR | Status: DC | PRN
  Administered 2022-04-22: 18 mL

## 2022-04-22 MED ORDER — MIDAZOLAM HCL 2 MG/2ML IJ SOLN
INTRAMUSCULAR | Status: AC
  Filled 2022-04-22: qty 2

## 2022-04-22 MED ORDER — LIDOCAINE HCL (CARDIAC) PF 100 MG/5ML IV SOSY
PREFILLED_SYRINGE | INTRAVENOUS | Status: DC | PRN
  Administered 2022-04-22: 80 mg via INTRAVENOUS

## 2022-04-22 MED ORDER — ACETAMINOPHEN 500 MG PO TABS
ORAL_TABLET | ORAL | Status: AC
  Filled 2022-04-22: qty 2

## 2022-04-22 MED ORDER — FENTANYL CITRATE (PF) 100 MCG/2ML IJ SOLN
INTRAMUSCULAR | Status: DC | PRN
  Administered 2022-04-22: 50 ug via INTRAVENOUS

## 2022-04-22 MED ORDER — PHENYLEPHRINE HCL (PRESSORS) 10 MG/ML IV SOLN
INTRAVENOUS | Status: DC | PRN
  Administered 2022-04-22 (×4): 80 ug via INTRAVENOUS

## 2022-04-22 MED ORDER — CEFAZOLIN SODIUM-DEXTROSE 2-4 GM/100ML-% IV SOLN
2.0000 g | INTRAVENOUS | Status: AC
  Administered 2022-04-22: 2 g via INTRAVENOUS

## 2022-04-22 MED ORDER — FENTANYL CITRATE (PF) 100 MCG/2ML IJ SOLN
25.0000 ug | INTRAMUSCULAR | Status: DC | PRN
  Administered 2022-04-22: 25 ug via INTRAVENOUS

## 2022-04-22 MED ORDER — LACTATED RINGERS IV SOLN: INTRAVENOUS | Status: DC

## 2022-04-22 MED ORDER — ROCURONIUM BROMIDE 100 MG/10ML IV SOLN
INTRAVENOUS | Status: DC | PRN
  Administered 2022-04-22: 50 mg via INTRAVENOUS
  Administered 2022-04-22: 10 mg via INTRAVENOUS

## 2022-04-22 MED ORDER — DEXAMETHASONE SODIUM PHOSPHATE 4 MG/ML IJ SOLN
INTRAMUSCULAR | Status: DC | PRN
  Administered 2022-04-22 (×2): 5 mg via INTRAVENOUS

## 2022-04-22 MED ORDER — PROPOFOL 10 MG/ML IV BOLUS
INTRAVENOUS | Status: AC
  Filled 2022-04-22: qty 20

## 2022-04-22 MED ORDER — CHLORHEXIDINE GLUCONATE CLOTH 2 % EX PADS: 6.0000 | MEDICATED_PAD | Freq: Once | CUTANEOUS | Status: DC

## 2022-04-22 MED ORDER — AMISULPRIDE (ANTIEMETIC) 5 MG/2ML IV SOLN: 10.0000 mg | Freq: Once | INTRAVENOUS | Status: DC | PRN

## 2022-04-22 MED ORDER — METHOCARBAMOL 750 MG PO TABS: 750.0000 mg | ORAL_TABLET | Freq: Three times a day (TID) | ORAL | 0 refills | Status: DC | PRN

## 2022-04-22 MED ORDER — PROMETHAZINE HCL 25 MG/ML IJ SOLN: 6.2500 mg | INTRAMUSCULAR | Status: DC | PRN

## 2022-04-22 MED ORDER — KETOROLAC TROMETHAMINE 30 MG/ML IJ SOLN
INTRAMUSCULAR | Status: AC
  Filled 2022-04-22: qty 1

## 2022-04-22 MED ORDER — ROCURONIUM BROMIDE 10 MG/ML (PF) SYRINGE
PREFILLED_SYRINGE | INTRAVENOUS | Status: AC
  Filled 2022-04-22: qty 10

## 2022-04-22 MED ORDER — FENTANYL CITRATE (PF) 100 MCG/2ML IJ SOLN
50.0000 ug | Freq: Once | INTRAMUSCULAR | Status: AC
  Administered 2022-04-22: 50 ug via INTRAVENOUS

## 2022-04-22 MED ORDER — MIDAZOLAM HCL 2 MG/2ML IJ SOLN
1.0000 mg | Freq: Once | INTRAMUSCULAR | Status: AC
  Administered 2022-04-22: 1 mg via INTRAVENOUS

## 2022-04-22 MED ORDER — EPHEDRINE SULFATE (PRESSORS) 50 MG/ML IJ SOLN
INTRAMUSCULAR | Status: DC | PRN
  Administered 2022-04-22 (×3): 5 mg via INTRAVENOUS
  Administered 2022-04-22: 10 mg via INTRAVENOUS

## 2022-04-22 MED ORDER — OXYCODONE HCL 5 MG PO TABS
ORAL_TABLET | ORAL | Status: AC
  Filled 2022-04-22: qty 1

## 2022-04-22 MED ORDER — LIDOCAINE 2% (20 MG/ML) 5 ML SYRINGE
INTRAMUSCULAR | Status: AC
  Filled 2022-04-22: qty 5

## 2022-04-22 MED ORDER — CLONIDINE HCL (ANALGESIA) 100 MCG/ML EP SOLN
EPIDURAL | Status: DC | PRN
  Administered 2022-04-22: 80 ug

## 2022-04-22 MED ORDER — ROPIVACAINE HCL 5 MG/ML IJ SOLN
INTRAMUSCULAR | Status: DC | PRN
  Administered 2022-04-22: 30 mL via PERINEURAL

## 2022-04-22 MED ORDER — SUGAMMADEX SODIUM 200 MG/2ML IV SOLN
INTRAVENOUS | Status: DC | PRN
  Administered 2022-04-22: 200 mg via INTRAVENOUS

## 2022-04-22 MED ORDER — ACETAMINOPHEN 500 MG PO TABS
1000.0000 mg | ORAL_TABLET | Freq: Once | ORAL | Status: AC
  Administered 2022-04-22: 1000 mg via ORAL

## 2022-04-22 MED ORDER — PROPOFOL 10 MG/ML IV BOLUS
INTRAVENOUS | Status: DC | PRN
  Administered 2022-04-22: 150 mg via INTRAVENOUS

## 2022-04-22 MED ORDER — CEFAZOLIN SODIUM-DEXTROSE 2-4 GM/100ML-% IV SOLN
INTRAVENOUS | Status: AC
  Filled 2022-04-22: qty 100

## 2022-04-22 MED ORDER — OXYCODONE HCL 5 MG PO TABS
5.0000 mg | ORAL_TABLET | Freq: Once | ORAL | Status: AC | PRN
  Administered 2022-04-22: 5 mg via ORAL

## 2022-04-22 MED ORDER — OXYCODONE HCL 5 MG PO TABS: 5.0000 mg | ORAL_TABLET | Freq: Four times a day (QID) | ORAL | 0 refills | Status: DC | PRN

## 2022-04-22 MED ORDER — IBUPROFEN 800 MG PO TABS
800.0000 mg | ORAL_TABLET | Freq: Three times a day (TID) | ORAL | 0 refills | Status: DC | PRN
Start: 2022-04-22 — End: 2022-05-21

## 2022-04-22 MED ORDER — ONDANSETRON HCL 4 MG/2ML IJ SOLN
INTRAMUSCULAR | Status: DC | PRN
  Administered 2022-04-22: 4 mg via INTRAVENOUS

## 2022-04-22 MED ORDER — MEPERIDINE HCL 25 MG/ML IJ SOLN: 6.2500 mg | INTRAMUSCULAR | Status: DC | PRN

## 2022-04-22 SURGICAL SUPPLY — 48 items
ADH SKN CLS APL DERMABOND .7 (GAUZE/BANDAGES/DRESSINGS) ×1
APL PRP STRL LF DISP 70% ISPRP (MISCELLANEOUS) ×1
BLADE CLIPPER SURG (BLADE) IMPLANT
BLADE SURG 15 STRL LF DISP TIS (BLADE) ×1 IMPLANT
BLADE SURG 15 STRL SS (BLADE) ×1
CANISTER SUCT 1200ML W/VALVE (MISCELLANEOUS) IMPLANT
CHLORAPREP W/TINT 26 (MISCELLANEOUS) ×1 IMPLANT
COVER BACK TABLE 60X90IN (DRAPES) ×1 IMPLANT
COVER MAYO STAND STRL (DRAPES) ×1 IMPLANT
DERMABOND ADVANCED .7 DNX12 (GAUZE/BANDAGES/DRESSINGS) ×1 IMPLANT
DRAIN PENROSE .5X12 LATEX STL (DRAIN) ×1 IMPLANT
DRAPE LAPAROTOMY TRNSV 102X78 (DRAPES) ×1 IMPLANT
DRAPE UTILITY XL STRL (DRAPES) ×1 IMPLANT
ELECT COATED BLADE 2.86 ST (ELECTRODE) ×1 IMPLANT
ELECT REM PT RETURN 9FT ADLT (ELECTROSURGICAL) ×1
ELECTRODE REM PT RTRN 9FT ADLT (ELECTROSURGICAL) ×1 IMPLANT
GAUZE 4X4 16PLY ~~LOC~~+RFID DBL (SPONGE) IMPLANT
GAUZE SPONGE 4X4 12PLY STRL LF (GAUZE/BANDAGES/DRESSINGS) IMPLANT
GLOVE BIOGEL PI IND STRL 8 (GLOVE) ×1 IMPLANT
GLOVE ECLIPSE 8.0 STRL XLNG CF (GLOVE) ×1 IMPLANT
GOWN STRL REUS W/ TWL LRG LVL3 (GOWN DISPOSABLE) ×2 IMPLANT
GOWN STRL REUS W/ TWL XL LVL3 (GOWN DISPOSABLE) ×1 IMPLANT
GOWN STRL REUS W/TWL LRG LVL3 (GOWN DISPOSABLE) ×1
GOWN STRL REUS W/TWL XL LVL3 (GOWN DISPOSABLE) ×2
MESH HERNIA SYS ULTRAPRO LRG (Mesh General) IMPLANT
NDL HYPO 25X1 1.5 SAFETY (NEEDLE) ×1 IMPLANT
NEEDLE HYPO 25X1 1.5 SAFETY (NEEDLE) ×1 IMPLANT
NS IRRIG 1000ML POUR BTL (IV SOLUTION) IMPLANT
PACK BASIN DAY SURGERY FS (CUSTOM PROCEDURE TRAY) ×1 IMPLANT
PENCIL SMOKE EVACUATOR (MISCELLANEOUS) ×1 IMPLANT
SLEEVE SCD COMPRESS KNEE MED (STOCKING) ×1 IMPLANT
SPIKE FLUID TRANSFER (MISCELLANEOUS) IMPLANT
SPONGE T-LAP 4X18 ~~LOC~~+RFID (SPONGE) ×1 IMPLANT
STRIP CLOSURE SKIN 1/2X4 (GAUZE/BANDAGES/DRESSINGS) IMPLANT
SUT MON AB 4-0 PC3 18 (SUTURE) ×1 IMPLANT
SUT NOVA 0 T19/GS 22DT (SUTURE) IMPLANT
SUT NOVA NAB DX-16 0-1 5-0 T12 (SUTURE) ×2 IMPLANT
SUT VIC AB 2-0 SH 27 (SUTURE) ×1
SUT VIC AB 2-0 SH 27XBRD (SUTURE) ×1 IMPLANT
SUT VIC AB 3-0 54X BRD REEL (SUTURE) IMPLANT
SUT VIC AB 3-0 BRD 54 (SUTURE)
SUT VICRYL 3-0 CR8 SH (SUTURE) ×1 IMPLANT
SUT VICRYL AB 2 0 TIE (SUTURE) IMPLANT
SUT VICRYL AB 2 0 TIES (SUTURE)
SYR CONTROL 10ML LL (SYRINGE) ×1 IMPLANT
TOWEL GREEN STERILE FF (TOWEL DISPOSABLE) ×1 IMPLANT
TUBE CONNECTING 20X1/4 (TUBING) IMPLANT
YANKAUER SUCT BULB TIP NO VENT (SUCTIONS) IMPLANT

## 2022-04-22 NOTE — H&P (Signed)
History of Present Illness: Shawn Fields is a 62 y.o. male who is seen today as an office consultation for evaluation of New Consultation and Inguinal Hernia  The patient presents for evaluation of a right inguinal hernia. It has been more noticeable over the last couple months. He is not having much pain. Of note he had a laparoscopic repair of his left inguinal hernia in 2019 by Dr. Andrey Campanile. He is quite active and is not limiting his activity. The bulge has been noticed also by his primary care doctor as well. No change in bowel or bladder function.  Review of Systems: A complete review of systems was obtained from the patient. I have reviewed this information and discussed as appropriate with the patient. See HPI as well for other ROS.    Medical History: Past Medical History:  Diagnosis Date  Sleep apnea   Patient Active Problem List  Diagnosis  Atrial fibrillation (CMS-HCC)   Past Surgical History:  Procedure Laterality Date  HERNIA REPAIR    No Known Allergies  Current Outpatient Medications on File Prior to Visit  Medication Sig Dispense Refill  flecainide (TAMBOCOR) 100 MG tablet Take 100 mg by mouth 2 (two) times daily  aspirin 81 MG chewable tablet Take 81 mg by mouth daily.  fluticasone (FLONASE) 50 mcg/actuation nasal spray 1 spray both nostrils daily  loratadine (CLARITIN) 10 mg tablet Take 10 mg by mouth daily.   No current facility-administered medications on file prior to visit.   Family History  Problem Relation Age of Onset  Coronary Artery Disease (Blocked arteries around heart) Father    Social History   Tobacco Use  Smoking Status Never  Smokeless Tobacco Not on file    Social History   Socioeconomic History  Marital status: Married  Tobacco Use  Smoking status: Never  Substance and Sexual Activity  Alcohol use: Yes  Comment: 1-2  Drug use: Never   Objective:   Vitals:  02/09/22 1142  BP: 125/77  Pulse: 51  Temp: 36.7 C (98 F)   SpO2: 99%  Weight: 83.5 kg (184 lb)  Height: 190.5 cm (6\' 3" )   Body mass index is 23 kg/m.  Physical Exam HENT:  Head: Normocephalic.  Cardiovascular:  Comments: A fib  NSR currently  Pulmonary:  Effort: Pulmonary effort is normal.  Breath sounds: No stridor.  Abdominal:  General: Abdomen is flat.  Tenderness: There is no abdominal tenderness.  Hernia: A hernia is present. Hernia is present in the right inguinal area. There is no hernia in the left inguinal area.   Comments: Laparoscopic scars noted. Reducible right inguinal hernia.  Musculoskeletal:  Cervical back: Normal range of motion.  Skin: General: Skin is warm.  Neurological:  General: No focal deficit present.  Mental Status: He is alert.  Coordination: Abnormal coordination: 35.  Psychiatric:  Mood and Affect: Mood normal.  Behavior: Behavior normal.     Assessment and Plan:   Diagnoses and all orders for this visit:  Right inguinal hernia   Discussed laparoscopic and open approaches. Discussed the pros and cons of each and the steps involved with each. After discussion of all the above, he is opted for open repair of his right inguinal hernia with mesh. Risks and benefits as well as recurrence rates, chronic pain, long-term expectations reviewed with the patient today. Discussed potential complications of bleeding, infection, recurrence, injury to neighboring structures, injury to internal organs, injury to nerves and blood vessels as well as the need for revisional surgery  if he recurs. We discussed laparoscopic approaches the limitations of that as well given the fact he had a previous laparoscopic repair. We discussed doing laparoscopy to assess or just fix the hernia with an open approach. He is opted for an open approach.   Hayden Rasmussen, MD

## 2022-04-22 NOTE — Transfer of Care (Signed)
Immediate Anesthesia Transfer of Care Note  Patient: Shawn Fields  Procedure(s) Performed: OPEN RIGHT INGUINAL HERNIA REPAIR WITH MESH (Right: Groin)  Patient Location: PACU  Anesthesia Type:GA combined with regional for post-op pain  Level of Consciousness: sedated  Airway & Oxygen Therapy: Patient Spontanous Breathing and Patient connected to face mask oxygen  Post-op Assessment: Report given to RN and Post -op Vital signs reviewed and stable  Post vital signs: Reviewed and stable  Last Vitals:  Vitals Value Taken Time  BP 117/71 04/22/22 1245  Temp    Pulse 59 04/22/22 1247  Resp 16 04/22/22 1247  SpO2 98 % 04/22/22 1247  Vitals shown include unvalidated device data.  Last Pain:  Vitals:   04/22/22 0926  TempSrc: Oral  PainSc: 3       Patients Stated Pain Goal: 3 (04/22/22 0926)  Complications: No notable events documented.

## 2022-04-22 NOTE — Anesthesia Procedure Notes (Signed)
Procedure Name: Intubation Date/Time: 04/22/2022 11:21 AM  Performed by: Burna Cash, CRNAPre-anesthesia Checklist: Patient identified, Emergency Drugs available, Suction available and Patient being monitored Patient Re-evaluated:Patient Re-evaluated prior to induction Oxygen Delivery Method: Circle system utilized Preoxygenation: Pre-oxygenation with 100% oxygen Induction Type: IV induction Ventilation: Mask ventilation without difficulty Laryngoscope Size: Glidescope and 4 Grade View: Grade IV Tube type: Oral Tube size: 7.5 mm Number of attempts: 2 Airway Equipment and Method: Stylet and Oral airway Placement Confirmation: ETT inserted through vocal cords under direct vision, positive ETCO2 and breath sounds checked- equal and bilateral Secured at: 22 cm Tube secured with: Tape Dental Injury: Teeth and Oropharynx as per pre-operative assessment  Difficulty Due To: Difficult Airway- due to anterior larynx Comments: Easy FMV, DL x1 w MAC #4, unable to visualize cords, Smooth ETT with Glidescope assist

## 2022-04-22 NOTE — Op Note (Signed)
Primary diagnosis: Right inguinal hernia reducible initial  Postoperative diagnosis: Right inguinal hernia initial reducible direct  Procedure: Repair of right inguinal hernia with ultra Pro hernia system mesh  Surgeon: Harriette Bouillon, MD  Assistant: Saunders Glance PA  Anesthesia: General with transversus abdominis plane block and 0.25% Marcaine plain  EBL: Minimal  Specimen: None  Indications for procedure: The patient is a 62 year old male with a right inguinal hernia.  We discussed options of repair both laparoscopic and open the pros and cons of each given his circumstances.  He opted for open repair of his right inguinal hernia with mesh after discussion of options.The risk of hernia repair include bleeding,  Infection,   Recurrence of the hernia,  Mesh use, chronic pain,  Organ injury,  Bowel injury,  Bladder injury,   nerve injury with numbness around the incision,  Death,  and worsening of preexisting  medical problems.  The alternatives to surgery have been discussed as well..  Long term expectations of both operative and non operative treatments have been discussed.   The patient agrees to proceed.     Description of procedure: The patient was met in the holding area and questions were answered.  Right side was marked as correct site.  Of note he underwent block per anesthesia protocol.  He was then taken back to the operating.  He is placed supine upon the operating table.  After induction of general anesthesia, right groin was prepped and draped in sterile fashion and timeout performed.  Proper patient, site and procedure verified.  0.25% Marcaine local anesthesia was infiltrated.  An oblique incision was made overlying the right inguinal canal.  Dissection was carried down through the subcutaneous fatty tissues and Scarpa's fascia until the aponeurosis of the external bleak was identified.  A scalpel was used to open the fibers with the Metzenbaum scissors extending the incision through  the external ring.  Cord structures were encircled with 1/4 inch Penrose drain to include the ilioinguinal nerve.  There is a large direct hernia noted.  The cord was examined there is no evidence of an indirect hernia.  A large piece of the ultra pro hernia system was utilized.  The floor the inguinal canal was opened to facilitate placement into the preperitoneal space.  It was deployed circumferentially.  The onlay was placed onto the floor of the inguinal canal.  It was secured to the shelving edge of the inguinal ligament, pubic tubercle and the internal oblique/conjoined tendon medially.  0 Novafil was used for this.  A slit was cut the cord structures.  The mesh was closed around the cord structures with the same 0 Novafil suture.  Of note the ilioinguinal nerve was divided because it was going to be compressed into the mesh and I concerns for entrapment and pain.  The iliohypogastric nerve was preserved.  There is no signs of bleeding.  The aponeurosis of the external bleak was closed with 2-0 Vicryl.  3-0 Vicryl was used to approximate Scarpa's fascia and 4-0 Monocryl was used to close the skin in a subcuticular fashion.  Dermabond is applied.  All counts found to be correct.  The patient was awoke extubated taken to recovery in satisfactory condition.  All counts were correct.

## 2022-04-22 NOTE — Anesthesia Procedure Notes (Signed)
Anesthesia Regional Block: TAP block   Pre-Anesthetic Checklist: , timeout performed,  Correct Patient, Correct Site, Correct Laterality,  Correct Procedure, Correct Position, site marked,  Risks and benefits discussed,  Surgical consent,  Pre-op evaluation,  At surgeon's request and post-op pain management  Laterality: Right  Prep: chloraprep       Needles:  Injection technique: Single-shot  Needle Type: Echogenic Stimulator Needle      Needle Gauge: 21     Additional Needles:   Procedures:,,,, ultrasound used (permanent image in chart),,    Narrative:  Start time: 04/22/2022 10:10 AM End time: 04/22/2022 10:30 AM Injection made incrementally with aspirations every 5 mL.  Performed by: Personally  Anesthesiologist: Lewie Loron, MD  Additional Notes: BP cuff, SpO2 and EKG monitors applied. Sedation begun.  Anesthetic injected incrementally, slowly, and after neg aspirations under direct ultrasound guidance. Good fascial spread noted. Patient tolerated well.

## 2022-04-22 NOTE — Discharge Instructions (Addendum)
[facility] _______[facility]  UMBILICAL OR INGUINAL HERNIA REPAIR: POST OP INSTRUCTIONS  Always review your discharge instruction sheet given to you by the facility where your surgery was performed. IF YOU HAVE DISABILITY OR FAMILY LEAVE FORMS, YOU MUST BRING THEM TO THE OFFICE FOR PROCESSING.   DO NOT GIVE THEM TO YOUR DOCTOR.  1. A  prescription for pain medication may be given to you upon discharge.  Take your pain medication as prescribed, if needed.  If narcotic pain medicine is not needed, then you may take acetaminophen (Tylenol) or ibuprofen (Advil) as needed. 2. Take your usually prescribed medications unless otherwise directed. If you need a refill on your pain medication, please contact your pharmacy.  They will contact our office to request authorization. Prescriptions will not be filled after 5 pm or on week-ends. 3. You should follow a light diet the first 24 hours after arrival home, such as soup and crackers, etc.  Be sure to include lots of fluids daily.  Resume your normal diet the day after surgery. 4.Most patients will experience some swelling and bruising around the umbilicus or in the groin and scrotum.  Ice packs and reclining will help.  Swelling and bruising can take several days to resolve.  6. It is common to experience some constipation if taking pain medication after surgery.  Increasing fluid intake and taking a stool softener (such as Colace) will usually help or prevent this problem from occurring.  A mild laxative (Milk of Magnesia or Miralax) should be taken according to package directions if there are no bowel movements after 48 hours. 7. Unless discharge instructions indicate otherwise, you may remove your bandages 24-48 hours after surgery, and you may shower at that time.  You may have steri-strips (small skin tapes) in place directly over the incision.  These strips should be left on the skin for 7-10 days.  If your surgeon used skin glue on the  incision, you may shower in 24 hours.  The glue will flake off over the next 2-3 weeks.  Any sutures or staples will be removed at the office during your follow-up visit. 8. ACTIVITIES:  You may resume regular (light) daily activities beginning the next day--such as daily self-care, walking, climbing stairs--gradually increasing activities as tolerated.  You may have sexual intercourse when it is comfortable.  Refrain from any heavy lifting or straining until approved by your doctor.  a.You may drive when you are no longer taking prescription pain medication, you can comfortably wear a seatbelt, and you can safely maneuver your car and apply brakes. b.RETURN TO WORK:   _____________________________________________  9.You should see your doctor in the office for a follow-up appointment approximately 2-3 weeks after your surgery.  Make sure that you call for this appointment within a day or two after you arrive home to insure a convenient appointment time. 10.OTHER INSTRUCTIONS: _________________________    _____________________________________  WHEN TO CALL YOUR DOCTOR: Fever over 101.0 Inability to urinate Nausea and/or vomiting Extreme swelling or bruising Continued bleeding from incision. Increased pain, redness, or drainage from the incision  The clinic staff is available to answer your questions during regular business hours.  Please don't hesitate to call and ask to speak to one of the nurses for clinical concerns.  If you have a medical emergency, go to the nearest emergency room or call 911.  A surgeon from [facility] is always on call at the hospital   [address], [employer]  [address], [place], [place]  [address] ?  [address] [address], [place], [place]   [address] [phone] ? [phone] ? FAX [phone] Web site: [email or web address]     Tylenol given at 9:30AM... do not take additional Tylenol until after 1:30PM today  Post Anesthesia Home Care  Instructions  Activity: Get plenty of rest for the remainder of the day. A responsible individual must stay with you for 24 hours following the procedure.  For the next 24 hours, DO NOT: -Drive a car -Advertising copywriter -Drink alcoholic beverages -Take any medication unless instructed by your physician -Make any legal decisions or sign important papers.  Meals: Start with liquid foods such as gelatin or soup. Progress to regular foods as tolerated. Avoid greasy, spicy, heavy foods. If nausea and/or vomiting occur, drink only clear liquids until the nausea and/or vomiting subsides. Call your physician if vomiting continues.  Special Instructions/Symptoms: Your throat may feel dry or sore from the anesthesia or the breathing tube placed in your throat during surgery. If this causes discomfort, gargle with warm salt water. The discomfort should disappear within 24 hours.  If you had a scopolamine patch placed behind your ear for the management of post- operative nausea and/or vomiting:  1. The medication in the patch is effective for 72 hours, after which it should be removed.  Wrap patch in a tissue and discard in the trash. Wash hands thoroughly with soap and water. 2. You may remove the patch earlier than 72 hours if you experience unpleasant side effects which may include dry mouth, dizziness or visual disturbances. 3. Avoid touching the patch. Wash your hands with soap and water after contact with the patch.    Regional Anesthesia Blocks  1. Numbness or the inability to move the "blocked" extremity may last from 3-48 hours after placement. The length of time depends on the medication injected and your individual response to the medication. If the numbness is not going away after 48 hours, call your surgeon.  2. The extremity that is blocked will need to be protected until the numbness is gone and the  Strength has returned. Because you cannot feel it, you will need to take extra care to  avoid injury. Because it may be weak, you may have difficulty moving it or using it. You may not know what position it is in without looking at it while the block is in effect.  3. For blocks in the legs and feet, returning to weight bearing and walking needs to be done carefully. You will need to wait until the numbness is entirely gone and the strength has returned. You should be able to move your leg and foot normally before you try and bear weight or walk. You will need someone to be with you when you first try to ensure you do not fall and possibly risk injury.  4. Bruising and tenderness at the needle site are common side effects and will resolve in a few days.  5. Persistent numbness or new problems with movement should be communicated to the surgeon or the [facility] [phone] [facility] [phone]).

## 2022-04-22 NOTE — Interval H&P Note (Signed)
History and Physical Interval Note:  04/22/2022 9:42 AM  Shawn Fields  has presented today for surgery, with the diagnosis of RIGHT INGUINAL HERNIA.  The various methods of treatment have been discussed with the patient and family. After consideration of risks, benefits and other options for treatment, the patient has consented to  Procedure(s): OPEN RIGHT INGUINAL HERNIA REPAIR WITH MESH (Right) as a surgical intervention.  The patient's history has been reviewed, patient examined, no change in status, stable for surgery.  I have reviewed the patient's chart and labs.  Questions were answered to the patient's satisfaction.    The risk of hernia repair include bleeding,  Infection,   Recurrence of the hernia,  Mesh use, chronic pain,  Organ injury,  Bowel injury,  Bladder injury,   nerve injury with numbness around the incision,  Death,  and worsening of preexisting  medical problems.  The alternatives to surgery have been discussed as well..  Long term expectations of both operative and non operative treatments have been discussed.   The patient agrees to proceed.  Homero A Vanissa Strength

## 2022-04-22 NOTE — Progress Notes (Signed)
Assisted Dr. Renold Don with right, transabdominal plane, ultrasound guided block. Side rails up, monitors on throughout procedure. See vital signs in flow sheet. Tolerated Procedure well.

## 2022-04-22 NOTE — Anesthesia Preprocedure Evaluation (Addendum)
Anesthesia Evaluation  Patient identified by MRN, date of birth, ID band Patient awake    Reviewed: Allergy & Precautions, NPO status , Patient's Chart, lab work & pertinent test results  Airway Mallampati: II  TM Distance: >3 FB Neck ROM: Full    Dental no notable dental hx. (+) Dental Advisory Given, Teeth Intact   Pulmonary sleep apnea and Continuous Positive Airway Pressure Ventilation    Pulmonary exam normal breath sounds clear to auscultation       Cardiovascular Normal cardiovascular exam+ dysrhythmias Atrial Fibrillation  Rhythm:Regular Rate:Normal  Echo 2019 - Left ventricle: The cavity size was normal. Wall thickness was normal. The estimated ejection fraction was 55%. Wall motion was normal; there were no regional wall motion abnormalities. Features are consistent with a pseudonormal left ventricular filling pattern, with concomitant abnormal relaxation and increased filling pressure (grade 2 diastolic dysfunction).  - Aortic valve: There was no stenosis.  - Mitral valve: There was trivial regurgitation.  - Right ventricle: The cavity size was normal. Systolic function was normal.  - Pulmonary arteries: No complete TR doppler jet so unable to estimate PA systolic pressure.  - Inferior vena cava: The vessel was normal in size. The respirophasic diameter changes were in the normal range (>= 50%), consistent with normal central venous pressure.   Impressions:   - Normal LV size with EF 55%. Moderate diastolic dysfunction. Normal RV size and systolic function. No significant valvular   abnormalities.     Neuro/Psych  Headaches    GI/Hepatic negative GI ROS, Neg liver ROS,,,  Endo/Other  negative endocrine ROS    Renal/GU negative Renal ROS     Musculoskeletal negative musculoskeletal ROS (+)    Abdominal   Peds  Hematology  (+) Blood dyscrasia, anemia   Anesthesia Other Findings   Reproductive/Obstetrics                              Anesthesia Physical Anesthesia Plan  ASA: 3  Anesthesia Plan: General   Post-op Pain Management: Regional block* and Tylenol PO (pre-op)*   Induction: Intravenous  PONV Risk Score and Plan: 2 and Ondansetron, Dexamethasone, Treatment may vary due to age or medical condition and Midazolam  Airway Management Planned: Oral ETT and Video Laryngoscope Planned  Additional Equipment:   Intra-op Plan:   Post-operative Plan: Extubation in OR  Informed Consent: I have reviewed the patients History and Physical, chart, labs and discussed the procedure including the risks, benefits and alternatives for the proposed anesthesia with the patient or authorized representative who has indicated his/her understanding and acceptance.     Dental advisory given  Plan Discussed with: CRNA  Anesthesia Plan Comments:         Anesthesia Quick Evaluation

## 2022-04-22 NOTE — Anesthesia Postprocedure Evaluation (Signed)
Anesthesia Post Note  Patient: Shawn Fields  Procedure(s) Performed: OPEN RIGHT INGUINAL HERNIA REPAIR WITH MESH (Right: Groin)     Patient location during evaluation: PACU Anesthesia Type: General Level of consciousness: sedated and patient cooperative Pain management: pain level controlled Vital Signs Assessment: post-procedure vital signs reviewed and stable Respiratory status: spontaneous breathing Cardiovascular status: stable Anesthetic complications: no   No notable events documented.  Last Vitals:  Vitals:   04/22/22 1330 04/22/22 1406  BP: 99/65 (!) 102/59  Pulse: (!) 55 (!) 56  Resp: 18 18  Temp:    SpO2: 99% 98%    Last Pain:  Vitals:   04/22/22 1300  TempSrc:   PainSc: 2                  Lewie Loron

## 2022-04-23 ENCOUNTER — Encounter (HOSPITAL_BASED_OUTPATIENT_CLINIC_OR_DEPARTMENT_OTHER): Payer: Self-pay | Admitting: Surgery

## 2022-05-07 DIAGNOSIS — H524 Presbyopia: Secondary | ICD-10-CM | POA: Diagnosis not present

## 2022-05-07 DIAGNOSIS — D3131 Benign neoplasm of right choroid: Secondary | ICD-10-CM | POA: Diagnosis not present

## 2022-05-18 ENCOUNTER — Telehealth: Payer: Self-pay | Admitting: Neurology

## 2022-05-18 NOTE — Telephone Encounter (Signed)
Confirmed that based off the last visit the patient was scheduled with Dr Vickey Huger for yearly appt and states can address movement disorder concern at that visit. Pt is scheduled may 9th with Dr Vickey Huger

## 2022-05-18 NOTE — Telephone Encounter (Signed)
Pt has appt 7/11 wanted to come sooner.  Requested earlier appt and is having issues. Nerve problems in his arm makes hand close and impairing his gait.

## 2022-05-18 NOTE — Telephone Encounter (Signed)
Pt also advised Dr. Leanord Asal at [facility] advised him to see Dr. Vickey Huger about this. They spoke about what was going on yesterday. He is concerned this could be early onset of parkinsons since it runs in his family.

## 2022-05-21 ENCOUNTER — Encounter: Payer: Self-pay | Admitting: Neurology

## 2022-05-21 ENCOUNTER — Ambulatory Visit (INDEPENDENT_AMBULATORY_CARE_PROVIDER_SITE_OTHER): Payer: BC Managed Care – PPO | Admitting: Neurology

## 2022-05-21 VITALS — BP 124/74 | HR 56

## 2022-05-21 DIAGNOSIS — G478 Other sleep disorders: Secondary | ICD-10-CM | POA: Diagnosis not present

## 2022-05-21 DIAGNOSIS — I480 Paroxysmal atrial fibrillation: Secondary | ICD-10-CM | POA: Diagnosis not present

## 2022-05-21 DIAGNOSIS — Z820 Family history of epilepsy and other diseases of the nervous system: Secondary | ICD-10-CM

## 2022-05-21 DIAGNOSIS — R29898 Other symptoms and signs involving the musculoskeletal system: Secondary | ICD-10-CM

## 2022-05-21 NOTE — Patient Instructions (Addendum)
62 year- old married male patient was seen  here for  a new problem-  Has been dx with sleep apnea in the past,  with PAP therapy by Dr Armanda Magic.  REM Sleep disorder was not verified in a sleep study, memory was intact.  A ministroke was evaluated by Dr Pearlean Brownie.     1) Loss of right arm swing. Weakness of shoulder elevation. Mild palpable tremor, but not visible.  Is this an atypical PD manifestation.   2) electric shock sensation into the right hand. Status post  shoulder surgery, 40 years ago he had a saw accident , severed thumb and index finger, re-attached with grafted neve tissue from the right elbow- inside.    3) MY goal is to investigate with a NCV and EMG test , comparing the left and right shoulder, paraspinal and arm.  4) MRI brain study in 2022,was read as normal.    This time DAT scan .  RV in 2-3 months-     I plan to follow up either personally or through our NP within 3-5 months.   I would like to thank Dr Delia Heady, MD  Tally Joe, MD and for allowing me to meet with and to take care of this pleasant patient.

## 2022-05-21 NOTE — Progress Notes (Signed)
Provider:  Melvyn Novas, MD  Primary Care Physician:  Shawn Joe, MD [phone] Shawn [address] [place] [address]     Referring Provider: Tally Joe, Md [address] [place],  [place] [address]          Chief Complaint according to patient   Patient presents with:     New Patient (Initial Visit)           HISTORY OF PRESENT ILLNESS:    Shawn Fields is a 62 y.o. male patient who is here for revisit 05/21/2022 for  a new problem .  Chief concern according to patient :  " I have been having trouble with my right arm after shoulder surgery  for shoulder bone spurs, had normal joint and rotator cuff function . His hand is now closed, he lost arm swing,  more stiffness.  He also has a fisted hand  during sleep. Needs to prop the arm up during the night. His wife has observed sudden  jerking movements and he feels as if he has received an electric shock. On Fleccanaide for atrial fib, now scheduling an ablation.   Shawn Fields also provided me with a CPAP download he is on a set CPAP pressure of 7 cm water without expiratory pressure relief and has been 87% compliant this means 26 out of 30 days of use.  The average time is 8 hours and 26 minutes of CPAP use each night and his residual AHI or apnea hypopnea index is 2.8/h.  Among those residual apneas are more central than obstructive apneas.  He has a high air leakage the 95th percentile is 37.8 L a minute and I do wonder if he needs a better fitting mask or an adjustment. I continued today's visit with a full neurologic examination.   Shawn Fields is a 62 y.o. year old White or Caucasian male patient seen upon referral on  from primary neurologist Dr. Pearlean Brownie, MD  for a sleep consultation on a patient that already is established with Dr. [facility], followed for CPAP and for atrial fibrillation ( onset in 2013)  RV 07/24/2021:  patient here with wife - this to discuss the sleep study  that they feel is not reflecting their concerns-  First of all his wife witnessed many movement events in his sleep , upper body - not necessarily a regular , rhythmic pattern- but a myoclonic jerking.  Shoulder pain is also present. Right shoulder, he now sleep on the left, uses CPAP .  He has constant pain.  He lost his sense of smell-  we did perform a MOCA- 28/ 30 points , normal.      Older brother has atrial fibrillation, father died with a massive heart attack. That let to his sleep study,and the patient now presents with concerns of waking stiff, rigid. His mother a had akinetic PD. Right arm dominant, while his mother had been left handed. Shawn Fields is a 62 y.o. year old White or Caucasian male patient seen upon referral on 08/29/2020 from primary neurologist Dr. Pearlean Brownie, MD  for a sleep consultation on a patient that already is established with Dr. [facility], followed for CPAP and for atrial fibrillation ( onset in 2013) . Older brother has atrial fibrillation, father died with a massive heart attack. That let to his sleep study,and the patient  now presents with RLS, had a mother with PD.    Chief concern according to patient : see above -    I have the pleasure of seeing Shawn Fields today, a right-handed White or Caucasian male with a known sleep disorder and actively followed by Shawn Grumbling, MD.    He  has a past medical history of Allergic rhinitis, Epididymal cyst, H/O actinic keratosis, H/O ganglion cyst, Onychomycosis, OSA (obstructive sleep apnea), and PAF (paroxysmal atrial fibrillation) (HCC). Type A , Chronically on flecainide, and on ASA. Became fatigued on Cardiazem, none now.    The patient had the first sleep study in the year 2013 at [facility] in lab-  PLMs arousals 3.3 /h,  with a result of an AHI ( Apnea Hypopnea index)  of 14/h, a RDI ( Respiratory Disturbance Index) of 20. No comment on CPAP titration about RLS, PLMs and PLMs in REM sleep. He reports  he is now acting out dreams, kicking occassionally, This started 15 years ago already, no happens every month. He had shoulder surgery, and after that he would wake up tingling , electric shock sensation .     Family medical /sleep history: Sister with CPAP/ OSA, brother atrial fib, mother had RLS and PD> .    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  and lives in a household with spouse . Has  age 42 and 25 years of children.  The patient currently works for himself.  Tobacco use/.  ETOH use / 4 / week, Caffeine intake in form of Coffee( 1 cup in AM) Tea ( at lunch).    Review of Systems: Out of a complete 14 system review, the patient complains of only the following symptoms, and all other reviewed systems are negative.:    Loss of armswing.    How likely are you to doze in the following situations: 0 = not likely, 1 = slight chance, 2 = moderate chance, 3 = high chance   Sitting and Reading? Watching Television? Sitting inactive in a public place (theater or meeting)? As a passenger in a car for an hour without a break? Lying down in the afternoon when circumstances permit? Sitting and talking to someone? Sitting quietly after lunch without alcohol? In a car, while stopped for a few minutes in traffic?   Total = 1/ 24 points   FSS endorsed at 14/ 63 points.   GDS 5/ 15   Social History   Socioeconomic History   Marital status: Married    Spouse name: Shawn Fields   Number of children: 2   Years of education: 16   Highest education level: Bachelor's degree (e.g., BA, AB, BS)  Occupational History   Occupation: [employer]   Tobacco Use   Smoking status: Never   Smokeless tobacco: Never  Vaping Use   Vaping Use: Never used  Substance and Sexual Activity   Alcohol use: Yes    Comment: social   Drug use: No   Sexual activity: Yes  Other Topics Concern   Not on file  Social History Narrative   Lives at home w wife   Drinks 1 cup a caffeine  a day    Left handed   Social Determinants of Health   Financial Resource Strain: Not on file  Food Insecurity: Not on file  Transportation Needs: Not on file  Physical Activity: Not on file  Stress: Not on file  Social Connections: Not on file    Family History  Problem Relation Age of Onset   Parkinson's disease Mother    Heart attack Father    Heart disease Father    Arrhythmia Brother     Past Medical History:  Diagnosis Date   Allergic rhinitis    Epididymal cyst    h/o    H/O actinic keratosis    Treated sith [place] periodically by Dr Nicholas Lose.   H/O ganglion cyst    in the left wrist that resolved.   Onychomycosis    treated w lamisil   OSA (obstructive sleep apnea)    uses CPAP nightly   PAF (paroxysmal atrial fibrillation) (HCC)    w CHADS VASC score 0-on ASA    Past Surgical History:  Procedure Laterality Date   COLONOSCOPY     HERNIA REPAIR     INGUINAL HERNIA REPAIR Right 04/22/2022   Procedure: OPEN RIGHT INGUINAL HERNIA REPAIR WITH MESH;  Surgeon: Harriette Bouillon, MD;  Location: [facility];  Service: General;  Laterality: Right;   SHOULDER ARTHROSCOPY Right      Current Outpatient Medications on File Prior to Visit  Medication Sig Dispense Refill   aspirin EC 81 MG tablet Take 81 mg by mouth daily.     flecainide (TAMBOCOR) 100 MG tablet TAKE 1 TABLET BY MOUTH TWICE A DAY 180 tablet 2   JUBLIA 10 % SOLN Apply topically daily.     NON FORMULARY CPAP     No current facility-administered medications on file prior to visit.    No Known Allergies   DIAGNOSTIC DATA (LABS, IMAGING, TESTING) - I reviewed patient records, labs, notes, testing and imaging myself where available.  Lab Results  Component Value Date   WBC 4.8 03/18/2022   HGB 14.7 03/18/2022   HCT 43.0 03/18/2022   MCV 91 03/18/2022   PLT 297 03/18/2022      Component Value Date/Time   NA 140 03/18/2022 0930   K 4.4 03/18/2022 0930   CL 104 03/18/2022 0930   CO2  24 03/18/2022 0930   GLUCOSE 88 03/18/2022 0930   GLUCOSE 79 12/16/2007 1231   BUN 17 03/18/2022 0930   CREATININE 1.07 03/18/2022 0930   CALCIUM 9.2 03/18/2022 0930   GFRNONAA >60 12/16/2007 1231   GFRAA  12/16/2007 1231    >60        The eGFR has been calculated using the MDRD equation. This calculation has not been validated in all clinical   Lab Results  Component Value Date   CHOL 164 06/11/2016   HDL 48 06/11/2016   LDLCALC 94 06/11/2016   TRIG 110 06/11/2016   CHOLHDL 3.4 06/11/2016   Lab Results  Component Value Date   HGBA1C 5.3 07/30/2014   No results found for: "VITAMINB12" No results found for: "TSH"  PHYSICAL EXAM:  Today's Vitals   05/21/22 1259  BP: 124/74  Pulse: (!) 56   There is no height or weight on file to calculate BMI.   Wt Readings from Last 3 Encounters:  04/22/22 182 lb 5.1 oz (82.7 kg)  04/15/22 185 lb (83.9 kg)  03/18/22 183 lb 6.4 oz (83.2 kg)     Ht Readings from Last 3 Encounters:  04/22/22 6\' 3"  (1.905 m)  04/15/22 6\' 3"  (1.905 m)  03/18/22 6\' 3"  (1.905 m)      General: T The patient is awake, alert and appears not in acute distress. The patient is well groomed. Head: Normocephalic, atraumatic. Neck is supple. Mallampati 2,  neck circumference:15 inches .  Nasal airflow  patent.  Retrognathia is mild.  Dental status:  Cardiovascular:  Regular rate and cardiac rhythm by pulse,  without distended neck veins. Respiratory: Lungs are clear to auscultation.  Skin:  Without evidence of ankle edema, or rash. Trunk: The patient's posture is erect.   Neurologic exam : The patient is awake and alert, oriented to place and time.   Memory subjective described as intact.       07/24/2021    3:39 PM  Montreal Cognitive Assessment   Visuospatial/ Executive (0/5) 5  Naming (0/3) 3  Attention: Read list of digits (0/2) 2  Attention: Read list of letters (0/1) 1  Attention: Serial 7 subtraction starting at 100 (0/3) 3  Language:  Repeat phrase (0/2) 2  Language : Fluency (0/1) 1  Abstraction (0/2) 2  Delayed Recall (0/5) 3  Orientation (0/6) 6  Total 28    Attention span & concentration ability appears normal.  Speech is fluent,  without dysarthria, dysphonia or aphasia.  Mood and affect are appropriate.   Cranial nerves: loss of smell for years.    UNMASKED FACE- no facial mimic loss.  Pupils are equal and briskly reactive to light. Funduscopic exam deferred...  Extraocular movements in vertical and horizontal planes were intact and without nystagmus.  Saccades to the left were more coarse.  No Diplopia. Visual fields by finger perimetry are intact. Hearing was intact to soft voice and finger rubbing.    Facial sensation intact to fine touch.  Facial motor strength- tongue and uvula move midline. Ptosis on the right eye.  Neck ROM : rotation, tilt and flexion extension were normal for age and shoulder shrug was slightly asymmetrical.  He lifted the left shoulder less,   Motor exam:     ELEVATED Tone without cog wheeling, waxy resistance of the right biceps and wrist , asymmetric grip strength .  Right with tremor- left side without.  He is left handed.   Weakness of the right thumb. Thenar eminance is weaker.  Pressure against r flexed elbow on the right is weaker. Loss of right arm swing.- new  no pill rolling tremor- he keeps the  shoulder elevated- protective. ROM overhead is limited due to shoulder injury.     Sensory:  Fine touch vibration were felt  normal.  Proprioception tested in the upper extremities was normal. Negative romberg.    Coordination: Rapid alternating movements in the fingers/hands were of normal speed.  Mild tremor at rest with outstretched arms.  The Finger-to-nose maneuver was intact without evidence of ataxia, dysmetria but with satellite tremor.    Gait and station: Patient could rise unassisted from a seated position, walked without assistive device. Not bracing himself.   Stance is of normal width/ base and the patient turned with 3 steps. Stable gait but without arm swing on the right.   Toe and heel walk were deferred.  Deep tendon reflexes: in the  upper and lower extremities are symmetric and 2 plus. Elbow reflex elicited on the right was more brisk and associated with a electric shock sensation.  Babinski response was deferred .    ASSESSMENT AND PLAN 62  year- old male  here with  PAP therapy by Dr Armanda Magic.  REM Sleep disorder was not verified in a sleep study, memory was intact.      1) Loss of right arm swing. Weakness of shoulder elevation. Mild palpable tremor, but not visible.  Is this an atypical PD manifestation. Mother has  CBD/ PSP   2) electric shock sensation into the right hand. Status post  shoulder surgery, 40 years ago he had a saw accident , severed thumb and index finger, re-attached with grafted neve tissue from the right elbow- inside.    3) MY goal is to investigate with a NCV and EMG test , comparing the left and right shoulder, paraspinal and arm.  4) MRI brain study in 2022,was read as normal.  Will repeat MRI brain and neck spine  Next time DAT scan to be discussed if MRI neck and brain is normal      I plan to follow up either personally or through our NP within 3-5 months.   I would like to thank Dr Delia Heady, MD  Shawn Joe, MD and for allowing me to meet with and to take care of this pleasant patient.    After spending a total time of  45  minutes face to face and additional time for physical and neurologic examination, review of laboratory studies,  personal review of imaging studies, reports and results of other testing and review of referral information / records as far as provided in visit,   Electronically signed by: Shawn Novas, MD 05/21/2022 1:15 PM  [facility] Board certified by The [employer] of Sleep Medicine and Diplomate of the [employer] of  Sleep Medicine. Board certified In Neurology through the ABPN, Fellow of the [employer] of Neurology. Medical Director of [facility].

## 2022-05-27 ENCOUNTER — Ambulatory Visit (INDEPENDENT_AMBULATORY_CARE_PROVIDER_SITE_OTHER): Payer: BC Managed Care – PPO

## 2022-05-27 ENCOUNTER — Telehealth: Payer: Self-pay | Admitting: Neurology

## 2022-05-27 DIAGNOSIS — I480 Paroxysmal atrial fibrillation: Secondary | ICD-10-CM | POA: Diagnosis not present

## 2022-05-27 DIAGNOSIS — Z820 Family history of epilepsy and other diseases of the nervous system: Secondary | ICD-10-CM | POA: Diagnosis not present

## 2022-05-27 DIAGNOSIS — G478 Other sleep disorders: Secondary | ICD-10-CM

## 2022-05-27 DIAGNOSIS — R29898 Other symptoms and signs involving the musculoskeletal system: Secondary | ICD-10-CM | POA: Diagnosis not present

## 2022-05-27 DIAGNOSIS — R6889 Other general symptoms and signs: Secondary | ICD-10-CM

## 2022-05-27 MED ORDER — GADOBENATE DIMEGLUMINE 529 MG/ML IV SOLN
15.0000 mL | Freq: Once | INTRAVENOUS | Status: AC | PRN
Start: 2022-05-27 — End: 2022-05-27
  Administered 2022-05-27: 15 mL via INTRAVENOUS

## 2022-05-27 NOTE — Telephone Encounter (Signed)
The patient is scheduled to get his MRIs done here at [facility] this afternoon. Dr. Garfield Cornea office at [facility] asked that we send them the MRI reports when they are available through fax and upload. Fax number [phone]. Thanks!

## 2022-05-28 ENCOUNTER — Encounter: Payer: Self-pay | Admitting: Neurology

## 2022-05-28 ENCOUNTER — Telehealth: Payer: Self-pay | Admitting: Neurology

## 2022-05-28 DIAGNOSIS — R6889 Other general symptoms and signs: Secondary | ICD-10-CM | POA: Insufficient documentation

## 2022-05-28 DIAGNOSIS — R29898 Other symptoms and signs involving the musculoskeletal system: Secondary | ICD-10-CM | POA: Insufficient documentation

## 2022-05-28 DIAGNOSIS — Z820 Family history of epilepsy and other diseases of the nervous system: Secondary | ICD-10-CM | POA: Insufficient documentation

## 2022-05-28 NOTE — Progress Notes (Unsigned)
DAT scan ordered.

## 2022-05-28 NOTE — Telephone Encounter (Signed)
Pt called, I informed him that MRI was normal and that Dr Vickey Huger hasn't sent in a referral to spine center. (Per nurse Baird Lyons)

## 2022-05-28 NOTE — Telephone Encounter (Signed)
Pt stated he received a call from the Pain Center at [facility]. Pt said he thought Dr. Vickey Huger was going to read MRI first. Pt is requesting a call back to discuss.

## 2022-05-28 NOTE — Telephone Encounter (Signed)
Dr Vickey Huger has not yet reviewed MRI, pt also sent a mychart message waiting to address with Dr Vickey Huger.

## 2022-06-11 ENCOUNTER — Telehealth: Payer: Self-pay | Admitting: Neurology

## 2022-06-11 NOTE — Telephone Encounter (Signed)
[facility]ta Numbers: [id number] exp. 06/11/22-08/09/22 sent to [facility] [phone]

## 2022-06-26 ENCOUNTER — Telehealth: Payer: Self-pay

## 2022-06-26 DIAGNOSIS — I480 Paroxysmal atrial fibrillation: Secondary | ICD-10-CM

## 2022-06-26 MED ORDER — APIXABAN 5 MG PO TABS: 5.0000 mg | ORAL_TABLET | Freq: Two times a day (BID) | ORAL | 6 refills | Status: DC

## 2022-06-26 NOTE — Telephone Encounter (Signed)
Pt is scheduled for an Afib Ablation on 08/14/22 at 7:30 AM.   He will come to [facility] for labs on 7/11 and get Eliquis coupon cards at that time. He is aware that he should start Eliquis 3 weeks prior to ablation (start on 7/12)  Instruction letters will be mailed to pt. Per his request.

## 2022-07-03 ENCOUNTER — Other Ambulatory Visit: Payer: Self-pay | Admitting: Internal Medicine

## 2022-07-23 ENCOUNTER — Encounter: Payer: BC Managed Care – PPO | Admitting: Neurology

## 2022-07-23 ENCOUNTER — Ambulatory Visit: Payer: BC Managed Care – PPO | Attending: Cardiology

## 2022-07-23 DIAGNOSIS — I480 Paroxysmal atrial fibrillation: Secondary | ICD-10-CM | POA: Diagnosis not present

## 2022-07-24 LAB — BASIC METABOLIC PANEL
BUN/Creatinine Ratio: 15 (ref 10–24)
BUN: 19 mg/dL (ref 8–27)
CO2: 24 mmol/L (ref 20–29)
Calcium: 9.1 mg/dL (ref 8.6–10.2)
Chloride: 103 mmol/L (ref 96–106)
Creatinine, Ser: 1.24 mg/dL (ref 0.76–1.27)
Glucose: 95 mg/dL (ref 70–99)
Potassium: 4.3 mmol/L (ref 3.5–5.2)
Sodium: 140 mmol/L (ref 134–144)
eGFR: 66 mL/min/{1.73_m2} (ref >59)

## 2022-07-24 LAB — CBC
Hematocrit: 38.2 % (ref 37.5–51.0)
Hemoglobin: 12.7 g/dL — ABNORMAL LOW (ref 13.0–17.7)
MCH: 30.5 pg (ref 26.6–33.0)
MCHC: 33.2 g/dL (ref 31.5–35.7)
MCV: 92 fL (ref 79–97)
Platelets: 259 10*3/uL (ref 150–450)
RBC: 4.16 x10E6/uL (ref 4.14–5.80)
RDW: 12.8 % (ref 11.6–15.4)
WBC: 6 10*3/uL (ref 3.4–10.8)

## 2022-07-27 ENCOUNTER — Ambulatory Visit (INDEPENDENT_AMBULATORY_CARE_PROVIDER_SITE_OTHER): Payer: Self-pay | Admitting: Neurology

## 2022-07-27 ENCOUNTER — Ambulatory Visit: Payer: BC Managed Care – PPO | Admitting: Neurology

## 2022-07-27 DIAGNOSIS — R29898 Other symptoms and signs involving the musculoskeletal system: Secondary | ICD-10-CM

## 2022-07-27 DIAGNOSIS — I480 Paroxysmal atrial fibrillation: Secondary | ICD-10-CM

## 2022-07-27 DIAGNOSIS — Z820 Family history of epilepsy and other diseases of the nervous system: Secondary | ICD-10-CM

## 2022-07-27 DIAGNOSIS — G478 Other sleep disorders: Secondary | ICD-10-CM

## 2022-07-27 DIAGNOSIS — M79601 Pain in right arm: Secondary | ICD-10-CM

## 2022-07-27 DIAGNOSIS — Z0289 Encounter for other administrative examinations: Secondary | ICD-10-CM

## 2022-07-27 NOTE — Progress Notes (Signed)
Full Name: Shawn Fields Gender: Male MRN #: [id number] Date of Birth: 09/28/1960    Visit Date: 07/27/2022 12:46 Age: 62 Years Examining Physician: Dr. Naomie Dean Referring Physician: Dr. Porfirio Mylar Dohmeier Height: 6 feet 3 inch  History: Right arm pain  Summary:  NCS performed on the bilateral uppers. The right median/ulnar (palm) comparison nerve showed prolonged distal peak latency (Median Palm, 2.3 ms, N<2.2) and abnormal peak latency difference (Median Palm-Ulnar Palm, 0.5 ms, N<0.4) with a relative median delay.   All remaining nerves (as indicated in the following tables) were within normal limits.   2. EMG performed on the right upper extremity: All muscles (as indicated in the following tables) were within normal limits.    Conclusion: This is an essentially normal exam. 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 Clinical correlation suggested.  Otherwise no electrophysiologic evidence for other mononeuropathy, polyneuropathy, cervical radiculopathy, brachial plexopathy or neurogenic muscle disease.    ------------------------------- Naomie Dean, M.D.  [facility] [address], [address] [place], [place] [address] Tel: [phone] Fax: [phone]  Verbal informed consent was obtained from the patient, patient was informed of potential risk of procedure, including bruising, bleeding, hematoma formation, infection, muscle weakness, muscle pain, numbness, among others.        MNC    Nerve / Sites Muscle Latency Ref. Amplitude Ref. Rel Amp Segments Distance Velocity Ref. Area    ms ms mV mV %  cm m/s m/s mVms  R Median - APB     Wrist APB 3.3 ?4.4 6.3 ?4.0 100 Wrist - APB 7   24.9     Upper arm APB 8.5  6.2  98.5 Upper arm - Wrist 30 58 ?49 25.0  L Median - APB     Wrist APB  3.6 ?4.4 4.8 ?4.0 100 Wrist - APB 7   15.1     Upper arm APB 9.3  3.9  82.2 Upper arm - Wrist 31 54 ?49 15.9  R Ulnar - ADM     Wrist ADM 2.4 ?3.3 10.8 ?6.0 100 Wrist - ADM 7   38.7     B.Elbow ADM 4.8  9.9  91.1 B.Elbow - Wrist 15 62 ?49 36.7     A.Elbow ADM 8.2  10.0  101 A.Elbow - B.Elbow 20 58 ?49 37.4  L Ulnar - ADM     Wrist ADM 2.1 ?3.3 10.0 ?6.0 100 Wrist - ADM 7   32.0     B.Elbow ADM 4.7  8.7  86.8 B.Elbow - Wrist 16 61 ?49 29.6     A.Elbow ADM 8.1  9.0  104 A.Elbow - B.Elbow 19 55 ?49 31.0               SNC    Nerve / Sites Rec. Site Peak Lat Ref.  Amp Ref. Segments Distance Peak Diff Ref.    ms ms V V  cm ms ms  R Radial - Anatomical snuff box (Forearm)     Forearm Wrist 2.6 ?2.9 39 ?15 Forearm - Wrist 10    L Radial - Anatomical snuff box (Forearm)     Forearm Wrist 2.2 ?2.9 29 ?15 Forearm - Wrist 10    R Median, Ulnar - Transcarpal comparison     Median Palm Wrist 2.3 ?2.2 43 ?35 Median Palm - Wrist 8  Ulnar Palm Wrist 1.8 ?2.2 15 ?12 Ulnar Palm - Wrist 8          Median Palm - Ulnar Palm  0.5 ?0.4  L Median, Ulnar - Transcarpal comparison     Median Palm Wrist 1.9 ?2.2 79 ?35 Median Palm - Wrist 8       Ulnar Palm Wrist 1.8 ?2.2 15 ?12 Ulnar Palm - Wrist 8          Median Palm - Ulnar Palm  0.1 ?0.4  R Median - Orthodromic (Dig II, Mid palm)     Dig II Wrist 3.1 ?3.4 14 ?10 Dig II - Wrist 13    L Median - Orthodromic (Dig II, Mid palm)     Dig II Wrist 3.0 ?3.4 18 ?10 Dig II - Wrist 13    R Ulnar - Orthodromic, (Dig V, Mid palm)     Dig V Wrist 2.8 ?3.1 14 ?5 Dig V - Wrist 11    L Ulnar - Orthodromic, (Dig V, Mid palm)     Dig V Wrist 2.7 ?3.1 8 ?5 Dig V - Wrist 11    R Lateral antebrachial cutaneous - Forearm (Elbow)     Elbow Forearm 1.5 ?3.0 14 ?10 Elbow - Forearm 12    L Lateral antebrachial cutaneous - Forearm (Elbow)     Elbow Forearm 2.4 ?3.0 17 ?10 Elbow - Forearm 12    R Medial antebrachial cutaneous - Forearm (Elbow)     Elbow Forearm 2.3 ?3.2 8  ?5 Elbow - Forearm 12    L Medial antebrachial cutaneous - Forearm (Elbow)     Elbow Forearm 2.9 ?3.2 27 ?5 Elbow - Forearm 12                               F  Wave    Nerve F Lat Ref.   ms ms  R Ulnar - ADM 30.3 ?32.0  L Ulnar - ADM 30.3 ?32.0

## 2022-07-27 NOTE — Procedures (Signed)
Full Name: Shawn Fields Gender: Male MRN #: [id number] Date of Birth: 06/22/1960    Visit Date: 07/27/2022 12:46 Age: 62 Years Examining Physician: Dr. Naomie Dean Referring Physician: Dr. Porfirio Mylar Dohmeier Height: 6 feet 3 inch  History: Right arm pain  Summary:  NCS performed on the bilateral uppers. The right median/ulnar (palm) comparison nerve showed prolonged distal peak latency (Median Palm, 2.3 ms, N<2.2) and abnormal peak latency difference (Median Palm-Ulnar Palm, 0.5 ms, N<0.4) with a relative median delay.   All remaining nerves (as indicated in the following tables) were within normal limits.   2. EMG performed on the right upper extremity: All muscles (as indicated in the following tables) were within normal limits.    Conclusion: This is an essentially normal exam. 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 Clinical correlation suggested.  Otherwise no electrophysiologic evidence for other mononeuropathy, polyneuropathy, cervical radiculopathy, brachial plexopathy or neurogenic muscle disease.    ------------------------------- Naomie Dean, M.D.  [facility] [address] [place], [place] [address] Tel: [phone] Fax: [phone]  Verbal informed consent was obtained from the patient, patient was informed of potential risk of procedure, including bruising, bleeding, hematoma formation, infection, muscle weakness, muscle pain, numbness, among others.        MNC    Nerve / Sites Muscle Latency Ref. Amplitude Ref. Rel Amp Segments Distance Velocity Ref. Area    ms ms mV mV %  cm m/s m/s mVms  R Median - APB     Wrist APB 3.3 ?4.4 6.3 ?4.0 100 Wrist - APB 7   24.9     Upper arm APB 8.5  6.2  98.5 Upper arm - Wrist 30 58 ?49 25.0  L Median - APB     Wrist APB  3.6 ?4.4 4.8 ?4.0 100 Wrist - APB 7   15.1     Upper arm APB 9.3  3.9  82.2 Upper arm - Wrist 31 54 ?49 15.9  R Ulnar - ADM     Wrist ADM 2.4 ?3.3 10.8 ?6.0 100 Wrist - ADM 7   38.7     B.Elbow ADM 4.8  9.9  91.1 B.Elbow - Wrist 15 62 ?49 36.7     A.Elbow ADM 8.2  10.0  101 A.Elbow - B.Elbow 20 58 ?49 37.4  L Ulnar - ADM     Wrist ADM 2.1 ?3.3 10.0 ?6.0 100 Wrist - ADM 7   32.0     B.Elbow ADM 4.7  8.7  86.8 B.Elbow - Wrist 16 61 ?49 29.6     A.Elbow ADM 8.1  9.0  104 A.Elbow - B.Elbow 19 55 ?49 31.0               SNC    Nerve / Sites Rec. Site Peak Lat Ref.  Amp Ref. Segments Distance Peak Diff Ref.    ms ms V V  cm ms ms  R Radial - Anatomical snuff box (Forearm)     Forearm Wrist 2.6 ?2.9 39 ?15 Forearm - Wrist 10    L Radial - Anatomical snuff box (Forearm)     Forearm Wrist 2.2 ?2.9 29 ?15 Forearm - Wrist 10    R Median, Ulnar - Transcarpal comparison     Median Palm Wrist 2.3 ?2.2 43 ?35 Median Palm - Wrist 8  Ulnar Palm Wrist 1.8 ?2.2 15 ?12 Ulnar Palm - Wrist 8          Median Palm - Ulnar Palm  0.5 ?0.4  L Median, Ulnar - Transcarpal comparison     Median Palm Wrist 1.9 ?2.2 79 ?35 Median Palm - Wrist 8       Ulnar Palm Wrist 1.8 ?2.2 15 ?12 Ulnar Palm - Wrist 8          Median Palm - Ulnar Palm  0.1 ?0.4  R Median - Orthodromic (Dig II, Mid palm)     Dig II Wrist 3.1 ?3.4 14 ?10 Dig II - Wrist 13    L Median - Orthodromic (Dig II, Mid palm)     Dig II Wrist 3.0 ?3.4 18 ?10 Dig II - Wrist 13    R Ulnar - Orthodromic, (Dig V, Mid palm)     Dig V Wrist 2.8 ?3.1 14 ?5 Dig V - Wrist 11    L Ulnar - Orthodromic, (Dig V, Mid palm)     Dig V Wrist 2.7 ?3.1 8 ?5 Dig V - Wrist 11    R Lateral antebrachial cutaneous - Forearm (Elbow)     Elbow Forearm 1.5 ?3.0 14 ?10 Elbow - Forearm 12    L Lateral antebrachial cutaneous - Forearm (Elbow)     Elbow Forearm 2.4 ?3.0 17 ?10 Elbow - Forearm 12    R Medial antebrachial cutaneous - Forearm (Elbow)     Elbow Forearm 2.3 ?3.2 8  ?5 Elbow - Forearm 12    L Medial antebrachial cutaneous - Forearm (Elbow)     Elbow Forearm 2.9 ?3.2 27 ?5 Elbow - Forearm 12                               F  Wave    Nerve F Lat Ref.   ms ms  R Ulnar - ADM 30.3 ?32.0  L Ulnar - ADM 30.3 ?32.0

## 2022-07-28 ENCOUNTER — Telehealth: Payer: Self-pay

## 2022-07-28 NOTE — Telephone Encounter (Signed)
-----   Message from Clyde Dohmeier sent at 07/28/2022 12:41 PM EDT ----- Recommend to wear soft wrist braces ( OTC, can be purchased at a pharmacy)  to give relief to median nerve at the right wrist.  PS : Please ask patient if he needs a script for HST reimbursement .   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

## 2022-07-28 NOTE — Progress Notes (Signed)
Recommend to wear soft wrist braces ( OTC, can be purchased at a pharmacy)  to give relief to median nerve at the right wrist.  PS : Please ask patient if he needs a script for HST reimbursement .   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

## 2022-07-28 NOTE — Telephone Encounter (Signed)
I spoke with the patient and his wife (via speaker phone). I relayed the results of the NCV/EMG. He was agreeable to purchasing a right wrist brace when he gets back in town. He verbalized understanding of the findings and expressed appreciation for the call. He had no further questions at this time. I reminded him of his follow up with Dr. Vickey Huger on 08/06/22.

## 2022-07-29 ENCOUNTER — Ambulatory Visit: Payer: BC Managed Care – PPO | Admitting: Neurology

## 2022-08-04 ENCOUNTER — Ambulatory Visit (HOSPITAL_COMMUNITY)
Admission: RE | Admit: 2022-08-04 | Discharge: 2022-08-04 | Disposition: A | Discharge status: Home / Self Care | Payer: BC Managed Care – PPO | Source: Ambulatory Visit | Attending: Neurology | Admitting: Neurology

## 2022-08-04 DIAGNOSIS — R6889 Other general symptoms and signs: Secondary | ICD-10-CM | POA: Diagnosis not present

## 2022-08-04 DIAGNOSIS — R29898 Other symptoms and signs involving the musculoskeletal system: Secondary | ICD-10-CM | POA: Diagnosis not present

## 2022-08-04 DIAGNOSIS — G478 Other sleep disorders: Secondary | ICD-10-CM

## 2022-08-04 DIAGNOSIS — I480 Paroxysmal atrial fibrillation: Secondary | ICD-10-CM | POA: Diagnosis not present

## 2022-08-04 DIAGNOSIS — Z820 Family history of epilepsy and other diseases of the nervous system: Secondary | ICD-10-CM

## 2022-08-04 MED ORDER — POTASSIUM IODIDE (ANTIDOTE) 130 MG PO TABS
ORAL_TABLET | ORAL | Status: AC
  Filled 2022-08-04: qty 1

## 2022-08-04 MED ORDER — IOFLUPANE I 123 185 MBQ/2.5ML IV SOLN
4.6000 | Freq: Once | INTRAVENOUS | Status: AC | PRN
  Administered 2022-08-04: 4.6 via INTRAVENOUS
  Filled 2022-08-04: qty 5

## 2022-08-05 ENCOUNTER — Encounter (HOSPITAL_COMMUNITY): Payer: Self-pay

## 2022-08-05 ENCOUNTER — Telehealth (HOSPITAL_COMMUNITY): Payer: Self-pay | Admitting: Emergency Medicine

## 2022-08-05 NOTE — Telephone Encounter (Signed)
Reaching out to patient to offer assistance regarding upcoming cardiac imaging study; pt verbalizes understanding of appt date/time, parking situation and where to check in, pre-test NPO status and medications ordered, and verified current allergies; name and call back number provided for further questions should they arise Sara Wallace RN Navigator Cardiac Imaging [facility] [phone] office [phone] cell 

## 2022-08-05 NOTE — Telephone Encounter (Signed)
Attempted to call patient regarding upcoming cardiac CT appointment. °Left message on voicemail with name and callback number °Sara Wallace RN Navigator Cardiac Imaging °[facility] °[phone] Office °[phone] Cell ° °

## 2022-08-06 ENCOUNTER — Encounter: Payer: Self-pay | Admitting: Neurology

## 2022-08-06 ENCOUNTER — Telehealth: Payer: Self-pay | Admitting: Neurology

## 2022-08-06 ENCOUNTER — Ambulatory Visit (INDEPENDENT_AMBULATORY_CARE_PROVIDER_SITE_OTHER): Payer: BC Managed Care – PPO | Admitting: Neurology

## 2022-08-06 VITALS — BP 106/63 | HR 60 | Ht 74.0 in | Wt 184.0 lb

## 2022-08-06 DIAGNOSIS — Z820 Family history of epilepsy and other diseases of the nervous system: Secondary | ICD-10-CM | POA: Diagnosis not present

## 2022-08-06 DIAGNOSIS — G20A1 Parkinson's disease without dyskinesia, without mention of fluctuations: Secondary | ICD-10-CM | POA: Diagnosis not present

## 2022-08-06 DIAGNOSIS — I480 Paroxysmal atrial fibrillation: Secondary | ICD-10-CM | POA: Diagnosis not present

## 2022-08-06 IMAGING — CT CT ABDOMEN W/ CM
2 of 3 series · 13 of 32 positions shown, 19 images · IV contrast (APPLIED)
Comparison: December 15, 2006

CLINICAL DATA: Upper abdominal pain, worse between meals and at
night. Elevated amylase and lipase.

EXAM:
CT ABDOMEN WITH CONTRAST
TECHNIQUE: Multidetector CT imaging of the abdomen was performed using the
standard protocol following bolus administration of intravenous
contrast.
CONTRAST:  100mL WOUASH-L99 IOPAMIDOL (WOUASH-L99) INJECTION 61%

[Series 2: abdroutine 5.0 i40s 1 · axial · 0.76mm/px · z∈[-242,-32]mm · 11 of 52 slices shown, 17 images]
[im 5/52  soft-tissue]
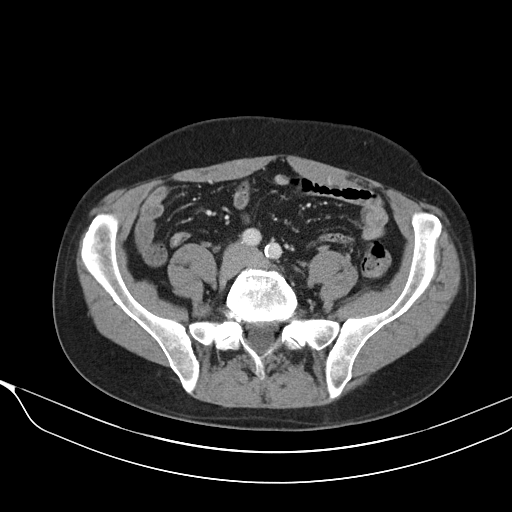
[im 5/52  bone]
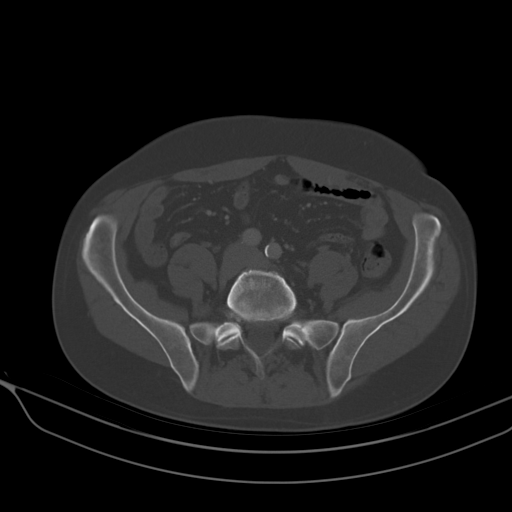
[im 9/52  soft-tissue]
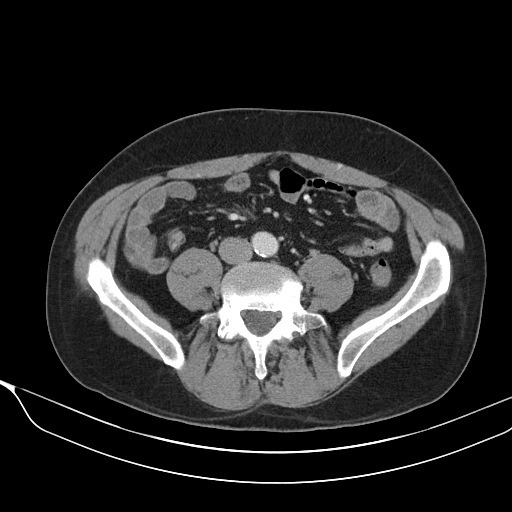
[im 13/52  soft-tissue]
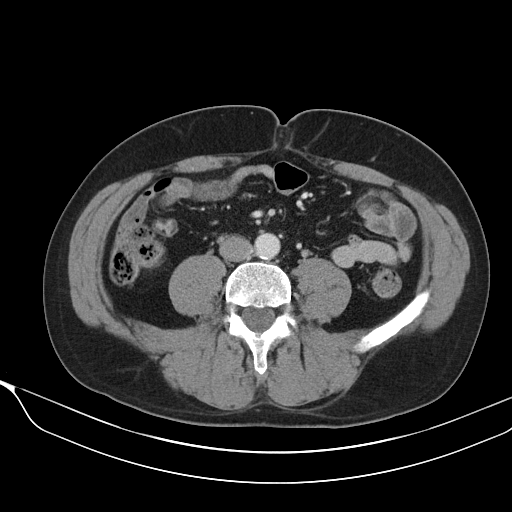
[im 18/52  soft-tissue]
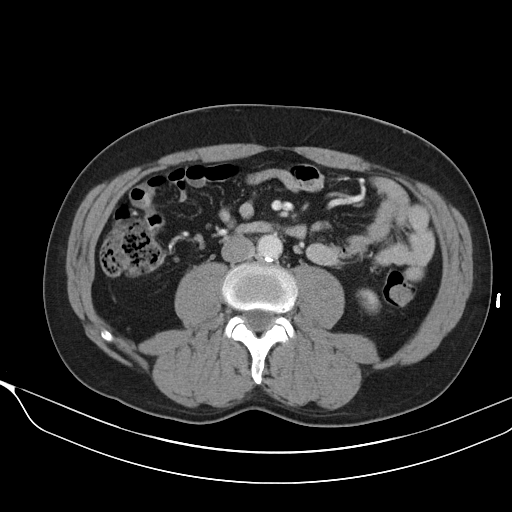
[im 22/52  soft-tissue]
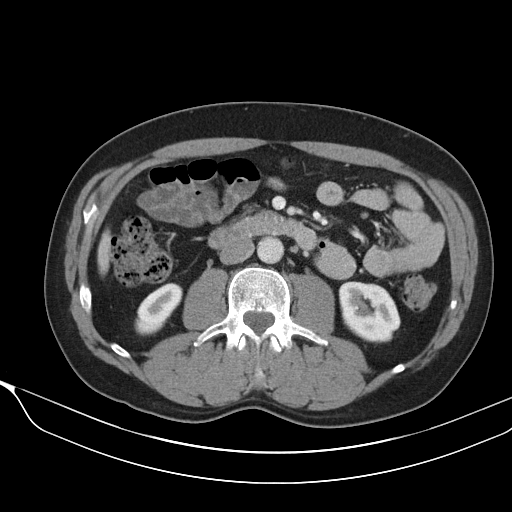
[im 26/52  soft-tissue]
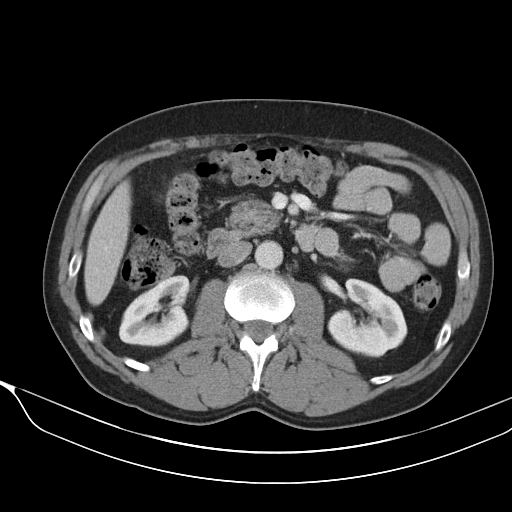
[im 30/52  soft-tissue]
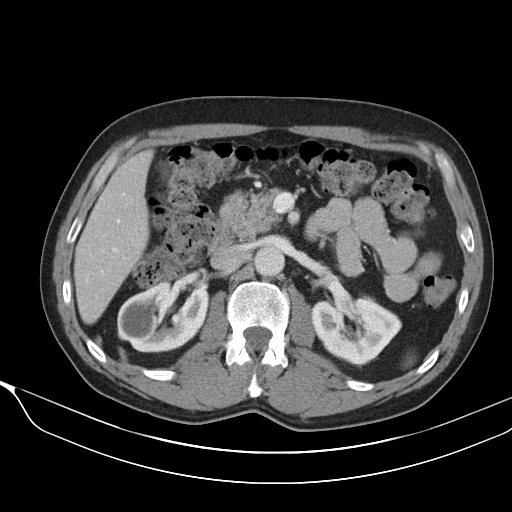
[im 35/52  soft-tissue]
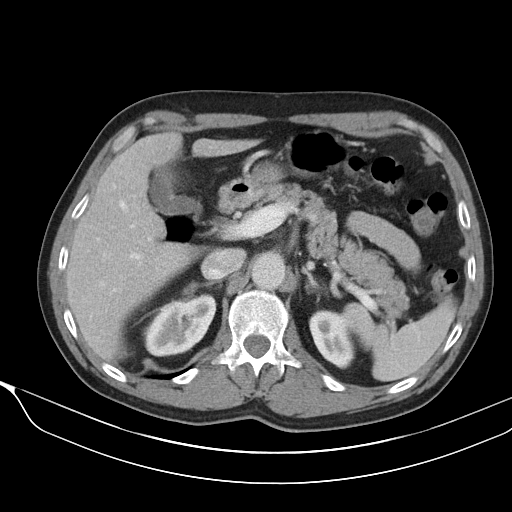
[im 35/52  lung]
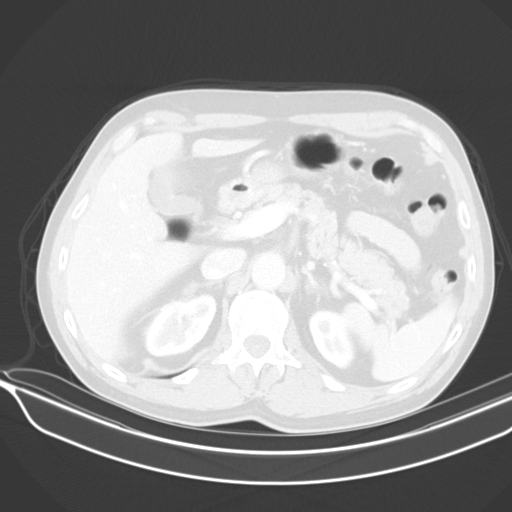
[im 39/52  soft-tissue]
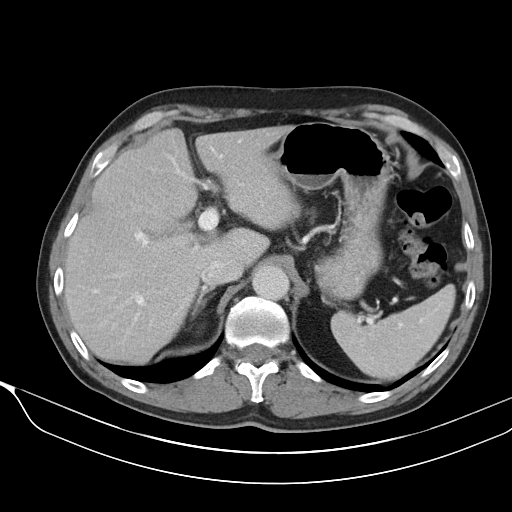
[im 39/52  lung]
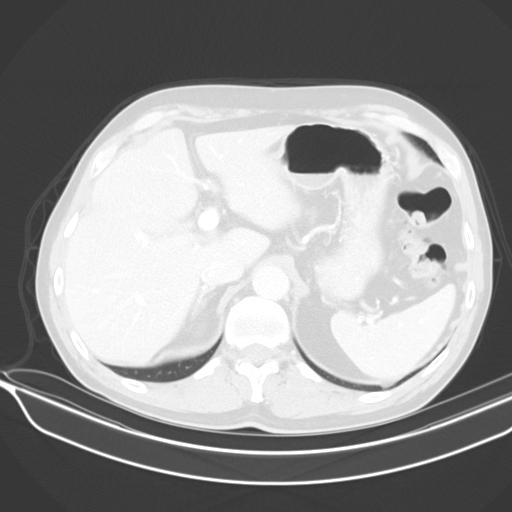
[im 39/52  bone]
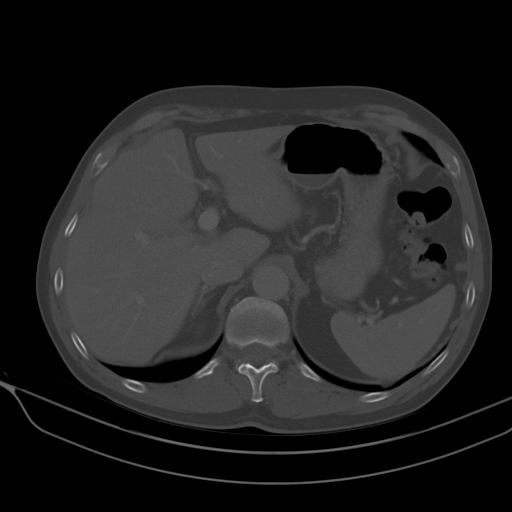
[im 43/52  soft-tissue]
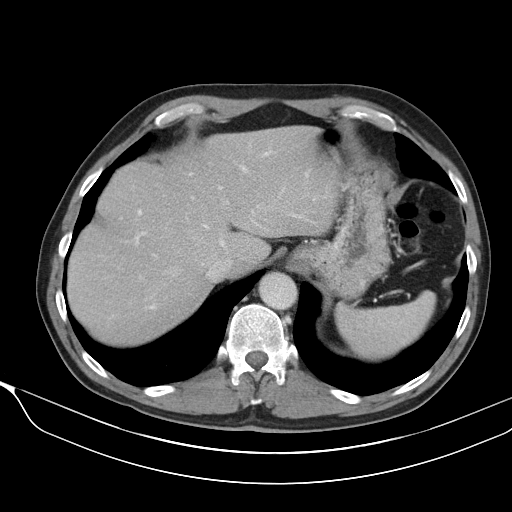
[im 43/52  lung]
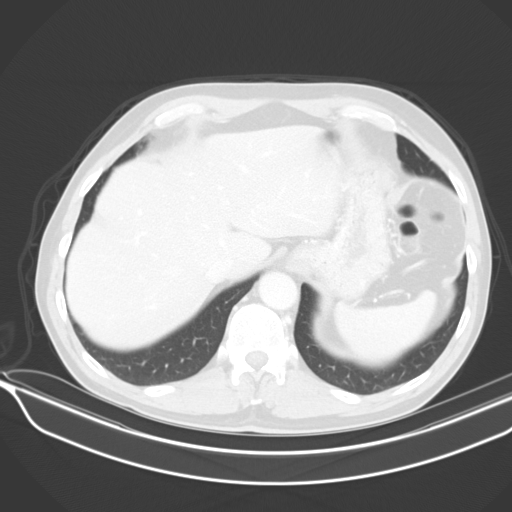
[im 47/52  soft-tissue]
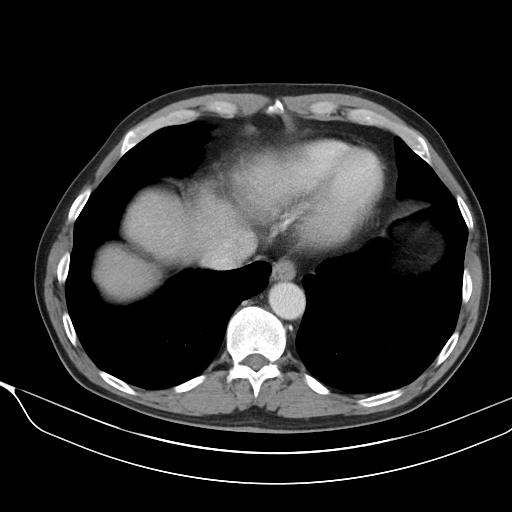
[im 47/52  lung]
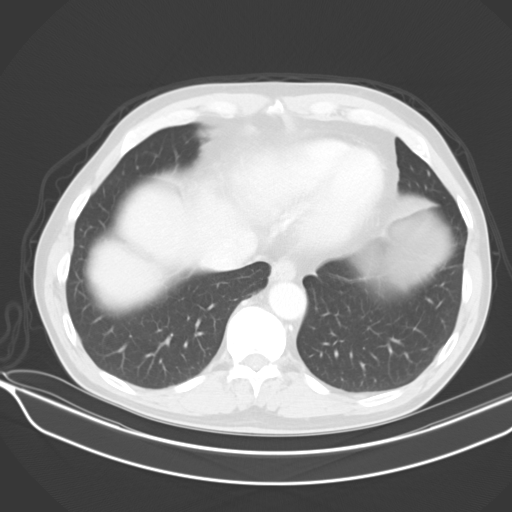

[Series 3: lungs · axial · 0.76mm/px · z∈[-94,-86]mm · 2 of 48 slices shown]
[im 5/48  soft-tissue]
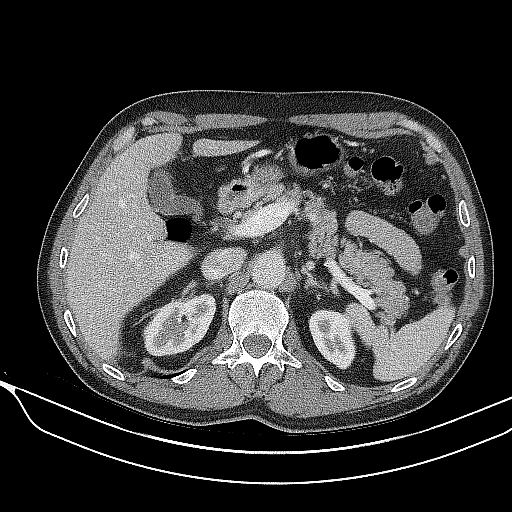
[im 9/48  soft-tissue]
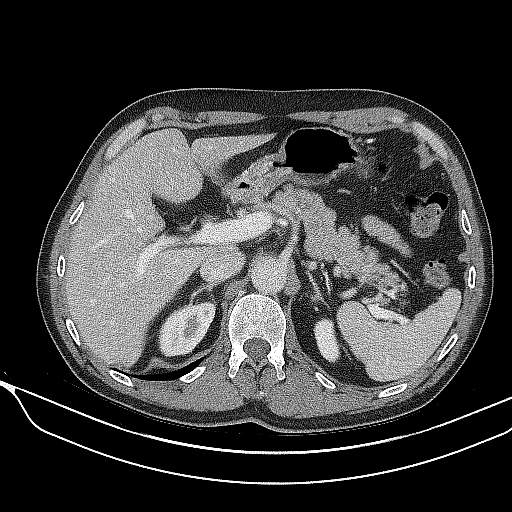

[13 of 32 positions shown; findings below may reference images not displayed]

FINDINGS: Lower chest: No acute abnormality.

Hepatobiliary: Hypodense subcentimeter segment V hepatic lesion on
image [DATE] is technically too small to accurately characterize but
stable dating back to December 15, 2006 consistent with a benign
hepatic cyst or hemangioma. No suspicious hepatic lesion.
Gallbladder is unremarkable. No biliary ductal dilation.

Pancreas: No pancreatic ductal dilation or evidence of acute
inflammation.

Spleen: Within normal limits.

Adrenals/Urinary Tract: Bilateral adrenal glands are unremarkable.
No hydronephrosis. Right upper pole renal cyst. No solid enhancing
renal mass.

Stomach/Bowel: Stomach is unremarkable for degree of distension. No
pathologic dilation or evidence of acute inflammation involving
loops of small or large bowel in the abdomen.

Vascular/Lymphatic: Aortic and branch vessel atherosclerosis without
abdominal aortic aneurysm. The portal, splenic and superior
mesenteric veins are patent. No pathologically enlarged abdominal
lymph nodes.

Other: No abdominal ascites.

Musculoskeletal: L5-S1 discogenic disease. No acute osseous
abnormality.
IMPRESSION: 1. No acute abnormality identified in the abdomen, specifically no
CT evidence of acute pancreatitis.
2. Aortic Atherosclerosis (6ZCV3-G33.3).

## 2022-08-06 MED ORDER — RASAGILINE MESYLATE 1 MG PO TABS: 1.0000 mg | ORAL_TABLET | Freq: Every day | ORAL | 5 refills | Status: DC

## 2022-08-06 MED ORDER — CARBIDOPA-LEVODOPA 25-100 MG PO TABS: 1.0000 | ORAL_TABLET | Freq: Three times a day (TID) | ORAL | 2 refills | Status: DC

## 2022-08-06 NOTE — Telephone Encounter (Signed)
Referral for neurology fax to [facility]. Phone: [phone], Fax: [phone].

## 2022-08-06 NOTE — Progress Notes (Signed)
Provider:  Melvyn Novas, MD  Primary Care Physician:  Shawn Joe, MD [phone] [address] [place] [address]     Referring Provider: Tally Joe, Md [address] [place],  [place] [address]          Chief Complaint according to patient   Patient presents with:     New Patient (Initial Visit)           HISTORY OF PRESENT ILLNESS:  Shawn Fields is a 63 y.o. male patient who is here for revisit 08/06/2022 for Test results .  Chief concern according to patient :  Tomorrow cardiac CT calcium scan, ablation this Friday,/  has not looked at results of recent tests. DAT scan . This was ordered by me. Right mild carpal tunnel wa seen on NCV and EMG>  DAT scan abnormal " left decreased Dopamine uptake ,  And this is for me diagnostic  I want him to undergo ablation and stabilize before he should start with PD directed exercise.   He will start on sinemet 25/ 100 Mg  His mother had an atypical PD, was followed by Dr. Rubin Payor,   he would like to be referred to him.  He will start with Parkinson's Disease exercise with ACT.      Shawn Fields is a 62 y.o. male patient who is here for revisit 05/21/2022 for  a new problem .  Chief concern according to patient :  " I have been having trouble with my right arm after shoulder surgery  for shoulder bone spurs, had normal joint and rotator cuff function . His hand is now closed, he lost arm swing,  more stiffness.  He also has a fisted hand  during sleep. Needs to prop the arm up during the night. His wife has observed sudden  jerking movements and he feels as if he has received an electric shock. On Fleccanaide for atrial fib, now scheduling an ablation.    Mr. Hartwig also provided me with a CPAP download he is on a set CPAP pressure of 7 cm water without expiratory pressure relief and has been 87% compliant this means 26 out of 30 days of use.  The average time is 8 hours and 26 minutes of CPAP use  each night and his residual AHI or apnea hypopnea index is 2.8/h.  Among those residual apneas are more central than obstructive apneas.  He has a high air leakage the 95th percentile is 37.8 L a minute and I do wonder if he needs a better fitting mask or an adjustment. I continued today's visit with a full neurologic examination.    .     Review of Systems: Out of a complete 14 system review, the patient complains of only the following symptoms, and all other reviewed systems are negative.:  .   Social History   Socioeconomic History   Marital status: Married    Spouse name: [name]   Number of children: 2   Years of education: 16   Highest education level: Bachelor's degree (e.g., BA, AB, BS)  Occupational History   Occupation: [employer]   Tobacco Use   Smoking status: Never   Smokeless tobacco: Never  Vaping Use   Vaping status: Never Used  Substance and Sexual Activity   Alcohol use: Yes    Comment: social   Drug use: No   Sexual activity: Yes  Other Topics  Concern   Not on file  Social History Narrative   Lives at home w wife   Drinks 1 cup a caffeine a day    Left handed   Social Determinants of Health   Financial Resource Strain: Not on file  Food Insecurity: Not on file  Transportation Needs: Not on file  Physical Activity: Not on file  Stress: Not on file  Social Connections: Not on file    Family History  Problem Relation Age of Onset   Parkinson's disease Mother    Heart attack Father    Heart disease Father    Arrhythmia Brother     Past Medical History:  Diagnosis Date   Allergic rhinitis    Epididymal cyst    h/o    H/O actinic keratosis    Treated sith Aldara periodically by Dr Nicholas Lose.   H/O ganglion cyst    in the left wrist that resolved.   Onychomycosis    treated w lamisil   OSA (obstructive sleep apnea)    uses CPAP nightly   PAF (paroxysmal atrial fibrillation) (HCC)    w CHADS VASC score 0-on ASA    Past Surgical  History:  Procedure Laterality Date   COLONOSCOPY     HERNIA REPAIR     INGUINAL HERNIA REPAIR Right 04/22/2022   Procedure: OPEN RIGHT INGUINAL HERNIA REPAIR WITH MESH;  Surgeon: Harriette Bouillon, MD;  Location: [facility];  Service: General;  Laterality: Right;   SHOULDER ARTHROSCOPY Right      Current Outpatient Medications on File Prior to Visit  Medication Sig Dispense Refill   apixaban (ELIQUIS) 5 MG TABS tablet Take 1 tablet (5 mg total) by mouth 2 (two) times daily. 60 tablet 6   flecainide (TAMBOCOR) 100 MG tablet TAKE 1 TABLET BY MOUTH TWICE A DAY 180 tablet 3   JUBLIA 10 % SOLN Apply topically daily.     NON FORMULARY CPAP     No current facility-administered medications on file prior to visit.    No Known Allergies   DIAGNOSTIC DATA (LABS, IMAGING, TESTING) - I reviewed patient records, labs, notes, testing and imaging myself where available.  Lab Results  Component Value Date   WBC 6.0 07/23/2022   HGB 12.7 (L) 07/23/2022   HCT 38.2 07/23/2022   MCV 92 07/23/2022   PLT 259 07/23/2022      Component Value Date/Time   NA 140 07/23/2022 1402   K 4.3 07/23/2022 1402   CL 103 07/23/2022 1402   CO2 24 07/23/2022 1402   GLUCOSE 95 07/23/2022 1402   GLUCOSE 79 12/16/2007 1231   BUN 19 07/23/2022 1402   CREATININE 1.24 07/23/2022 1402   CALCIUM 9.1 07/23/2022 1402   GFRNONAA >60 12/16/2007 1231   GFRAA  12/16/2007 1231    >60        The eGFR has been calculated using the MDRD equation. This calculation has not been validated in all clinical   Lab Results  Component Value Date   CHOL 164 06/11/2016   HDL 48 06/11/2016   LDLCALC 94 06/11/2016   TRIG 110 06/11/2016   CHOLHDL 3.4 06/11/2016   Lab Results  Component Value Date   HGBA1C 5.3 07/30/2014   No results found for: "VITAMINB12" No results found for: "TSH"  PHYSICAL EXAM:  Today's Vitals   08/06/22 1403  BP: 106/63  Pulse: 60  Weight: 184 lb (83.5 kg)  Height: 6\' 2"  (1.88 m)    Body mass index  is 23.62 kg/m.   Wt Readings from Last 3 Encounters:  08/06/22 184 lb (83.5 kg)  04/22/22 182 lb 5.1 oz (82.7 kg)  04/15/22 185 lb (83.9 kg)     Ht Readings from Last 3 Encounters:  08/06/22 6\' 2"  (1.88 m)  04/22/22 6\' 3"  (1.905 m)  04/15/22 6\' 3"  (1.905 m)      General: The patient is awake, alert and appears not in acute distress. The patient is well groomed. Head: Normocephalic, atraumatic. Neck is supple. Cardiovascular:  Regular rate and cardiac rhythm by pulse,  without distended neck veins. Respiratory: Lungs are clear to auscultation.  Skin:  Without evidence of ankle edema, or rash. Trunk: The patient's posture is erect.   NEUROLOGIC EXAM: The patient is awake and alert, oriented to place and time.   Memory subjective described as intact.  Attention span & concentration ability appears normal.  Speech is fluent,  with mild  dysphonia or aphasia.  Mood and affect are appropriate.   Cranial nerves:  loss of smell reported  Pupils are equal and briskly reactive to light. Extraocular movements in vertical and horizontal planes were intact and without nystagmus. No Diplopia. Visual fields by finger perimetry are intact. Hearing was intact to soft voice and finger rubbing.    Facial sensation intact to fine touch.  Facial motor strength is symmetric and tongue and uvula move midline.  Neck ROM : rotation, tilt and flexion extension were normal for age and shoulder shrug was symmetrical.    Motor exam:  Symmetric bulk, tone and ROM.   Normal tone with increased  cog wheeling, symmetric grip strength .   Sensory:  Fine touch, pinprick and vibration were tested  and  normal.  Proprioception tested in the upper extremities was normal.   Coordination: Rapid alternating movements in the fingers/hands were of reduced speed on the right .  The Finger-to-nose maneuver was intact without evidence of ataxia, and very little  tremor.   Gait and station: Patient  could rise unassisted from a seated position, he walked without assistive device. Stooped posture.  Stance is of normal width/ base  Deep tendon reflexes: in the  upper and lower extremities are symmetric and intact.  Babinski response was deferred   ASSESSMENT AND PLAN 62 y.o. year old male  here with:  new dx of Parkinsons' Syndrome    DAT scan abnormal " left decreased Dopamine uptake ,  And this is for me diagnostic  I want him to undergo ablation and stabilize before he should start with PD directed exercise.   He will start on sinemet 25/ 100 Mg , I like to start on 2 mg  His mother had an atypical PD, was followed by Dr. Rubin Payor,   he would like to be referred to him.  He will start with Parkinson's Disease exercise with ACT.   I plan to follow up either personally or through our NP within 5-6 months.   I would like to thank Shawn Joe, MD for allowing me to meet with and to take care of this pleasant patient.   CC: I will share my notes with Dr Rubin Payor .  After spending a total time of  30  minutes face to face and additional time for physical and neurologic examination, review of laboratory studies,  personal review of imaging studies, reports and results of other testing and review of referral information / records as far as provided in visit,   Electronically signed by: Melvyn Novas,  MD 08/06/2022 2:15 PM  [facility] and [employer] certified by [employer] of Sleep Medicine and Diplomate of the [employer] of Sleep Medicine. Board certified In Neurology through the ABPN, Fellow of the [employer] of Neu[employer].

## 2022-08-07 ENCOUNTER — Ambulatory Visit (HOSPITAL_COMMUNITY)
Admission: RE | Admit: 2022-08-07 | Discharge: 2022-08-07 | Disposition: A | Discharge status: Home / Self Care | Payer: BC Managed Care – PPO | Source: Ambulatory Visit | Attending: Cardiology | Admitting: Cardiology

## 2022-08-07 DIAGNOSIS — I480 Paroxysmal atrial fibrillation: Secondary | ICD-10-CM | POA: Diagnosis not present

## 2022-08-07 MED ORDER — IOHEXOL 350 MG/ML SOLN
100.0000 mL | Freq: Once | INTRAVENOUS | Status: AC | PRN
  Administered 2022-08-07: 100 mL via INTRAVENOUS

## 2022-08-10 ENCOUNTER — Encounter: Payer: BC Managed Care – PPO | Admitting: Neurology

## 2022-08-13 NOTE — Pre-Procedure Instructions (Signed)
Instructed patient on the following items: Arrival time 0515  Nothing to eat or drink after midnight No meds AM of procedure Responsible person to drive you home and stay with you for 24 hrs  Have you missed any doses of anti-coagulant Eliquis- Takes twice a day, hasn't missed any doses.  Don't take in the morning.

## 2022-08-14 ENCOUNTER — Ambulatory Visit (HOSPITAL_COMMUNITY): Payer: BC Managed Care – PPO | Admitting: Anesthesiology

## 2022-08-14 ENCOUNTER — Encounter (HOSPITAL_COMMUNITY)
Admission: RE | Disposition: A | Discharge status: Home / Self Care | Payer: Self-pay | Source: Home / Self Care | Attending: Cardiology

## 2022-08-14 ENCOUNTER — Ambulatory Visit (HOSPITAL_COMMUNITY)
Admission: RE | Admit: 2022-08-14 | Discharge: 2022-08-14 | Disposition: A | Discharge status: Home / Self Care | Payer: BC Managed Care – PPO | Attending: Cardiology | Admitting: Cardiology

## 2022-08-14 DIAGNOSIS — G4733 Obstructive sleep apnea (adult) (pediatric): Secondary | ICD-10-CM | POA: Diagnosis not present

## 2022-08-14 DIAGNOSIS — I480 Paroxysmal atrial fibrillation: Secondary | ICD-10-CM

## 2022-08-14 DIAGNOSIS — Z8249 Family history of ischemic heart disease and other diseases of the circulatory system: Secondary | ICD-10-CM | POA: Insufficient documentation

## 2022-08-14 DIAGNOSIS — I4891 Unspecified atrial fibrillation: Secondary | ICD-10-CM | POA: Diagnosis not present

## 2022-08-14 DIAGNOSIS — I483 Typical atrial flutter: Secondary | ICD-10-CM

## 2022-08-14 HISTORY — PX: ATRIAL FIBRILLATION ABLATION: EP1191

## 2022-08-14 LAB — POCT ACTIVATED CLOTTING TIME
Activated Clotting Time: 305 seconds
Activated Clotting Time: 299 seconds

## 2022-08-14 SURGERY — ATRIAL FIBRILLATION ABLATION
Anesthesia: General

## 2022-08-14 MED ORDER — PHENYLEPHRINE HCL-NACL 20-0.9 MG/250ML-% IV SOLN
INTRAVENOUS | Status: DC | PRN
  Administered 2022-08-14: 50 ug/min via INTRAVENOUS

## 2022-08-14 MED ORDER — HEPARIN (PORCINE) IN NACL 1000-0.9 UT/500ML-% IV SOLN
INTRAVENOUS | Status: DC | PRN
  Administered 2022-08-14 (×4): 500 mL

## 2022-08-14 MED ORDER — SODIUM CHLORIDE 0.9 % IV SOLN: INTRAVENOUS | Status: DC

## 2022-08-14 MED ORDER — ACETAMINOPHEN 325 MG PO TABS: 650.0000 mg | ORAL_TABLET | ORAL | Status: DC | PRN

## 2022-08-14 MED ORDER — FENTANYL CITRATE (PF) 250 MCG/5ML IJ SOLN
INTRAMUSCULAR | Status: DC | PRN
  Administered 2022-08-14: 100 ug via INTRAVENOUS

## 2022-08-14 MED ORDER — SUGAMMADEX SODIUM 200 MG/2ML IV SOLN
INTRAVENOUS | Status: DC | PRN
  Administered 2022-08-14: 200 mg via INTRAVENOUS

## 2022-08-14 MED ORDER — DOBUTAMINE INFUSION FOR EP/ECHO/NUC (1000 MCG/ML)
INTRAVENOUS | Status: AC
  Filled 2022-08-14: qty 250

## 2022-08-14 MED ORDER — PROPOFOL 10 MG/ML IV BOLUS
INTRAVENOUS | Status: DC | PRN
Start: 2022-08-14 — End: 2022-08-14
  Administered 2022-08-14: 150 mg via INTRAVENOUS

## 2022-08-14 MED ORDER — DEXAMETHASONE SODIUM PHOSPHATE 10 MG/ML IJ SOLN
INTRAMUSCULAR | Status: DC | PRN
  Administered 2022-08-14: 5 mg via INTRAVENOUS

## 2022-08-14 MED ORDER — PROTAMINE SULFATE 10 MG/ML IV SOLN
INTRAVENOUS | Status: DC | PRN
  Administered 2022-08-14 (×5): 10 mg via INTRAVENOUS

## 2022-08-14 MED ORDER — HEPARIN SODIUM (PORCINE) 1000 UNIT/ML IJ SOLN
INTRAMUSCULAR | Status: DC | PRN
  Administered 2022-08-14: 1000 [IU] via INTRAVENOUS

## 2022-08-14 MED ORDER — PHENYLEPHRINE 80 MCG/ML (10ML) SYRINGE FOR IV PUSH (FOR BLOOD PRESSURE SUPPORT)
PREFILLED_SYRINGE | INTRAVENOUS | Status: DC | PRN
  Administered 2022-08-14 (×2): 80 ug via INTRAVENOUS

## 2022-08-14 MED ORDER — LIDOCAINE 2% (20 MG/ML) 5 ML SYRINGE
INTRAMUSCULAR | Status: DC | PRN
  Administered 2022-08-14: 100 mg via INTRAVENOUS

## 2022-08-14 MED ORDER — ONDANSETRON HCL 4 MG/2ML IJ SOLN: 4.0000 mg | Freq: Four times a day (QID) | INTRAMUSCULAR | Status: DC | PRN

## 2022-08-14 MED ORDER — SODIUM CHLORIDE 0.9% FLUSH: 3.0000 mL | Freq: Two times a day (BID) | INTRAVENOUS | Status: DC

## 2022-08-14 MED ORDER — ROCURONIUM BROMIDE 10 MG/ML (PF) SYRINGE
PREFILLED_SYRINGE | INTRAVENOUS | Status: DC | PRN
  Administered 2022-08-14: 70 mg via INTRAVENOUS

## 2022-08-14 MED ORDER — HEPARIN SODIUM (PORCINE) 1000 UNIT/ML IJ SOLN
INTRAMUSCULAR | Status: DC | PRN
  Administered 2022-08-14: 14000 [IU] via INTRAVENOUS
  Administered 2022-08-14: 3000 [IU] via INTRAVENOUS

## 2022-08-14 MED ORDER — SODIUM CHLORIDE 0.9 % IV SOLN: 250.0000 mL | INTRAVENOUS | Status: DC | PRN

## 2022-08-14 MED ORDER — SODIUM CHLORIDE 0.9% FLUSH: 3.0000 mL | INTRAVENOUS | Status: DC | PRN

## 2022-08-14 MED ORDER — ONDANSETRON HCL 4 MG/2ML IJ SOLN
INTRAMUSCULAR | Status: DC | PRN
  Administered 2022-08-14: 4 mg via INTRAVENOUS

## 2022-08-14 SURGICAL SUPPLY — 21 items
BAG SNAP BAND KOVER 36X36 (MISCELLANEOUS) IMPLANT
CATH 8FR REPROCESSED SOUNDSTAR (CATHETERS) ×1 IMPLANT
CATH 8FR SOUNDSTAR REPROCESSED (CATHETERS) IMPLANT
CATH ABLAT QDOT MICRO BI TC DF (CATHETERS) IMPLANT
CATH OCTARAY 2.0 F 3-3-3-3-3 (CATHETERS) IMPLANT
CATH PIGTAIL STEERABLE D1 8.7 (WIRE) IMPLANT
CATH S-M CIRCA TEMP PROBE (CATHETERS) IMPLANT
CATH WEB BI DIR CSDF CRV REPRO (CATHETERS) IMPLANT
CLOSURE MYNX CONTROL 6F/7F (Vascular Products) IMPLANT
CLOSURE PERCLOSE PROSTYLE (VASCULAR PRODUCTS) IMPLANT
COVER SWIFTLINK CONNECTOR (BAG) ×1 IMPLANT
PACK EP LATEX FREE (CUSTOM PROCEDURE TRAY) ×1
PACK EP LF (CUSTOM PROCEDURE TRAY) ×1 IMPLANT
PAD DEFIB RADIO PHYSIO CONN (PAD) ×1 IMPLANT
PATCH CARTO3 (PAD) IMPLANT
SHEATH CARTO VIZIGO SM CVD (SHEATH) IMPLANT
SHEATH PINNACLE 7F 10CM (SHEATH) IMPLANT
SHEATH PINNACLE 8F 10CM (SHEATH) IMPLANT
SHEATH PINNACLE 9F 10CM (SHEATH) IMPLANT
SHEATH PROBE COVER 6X72 (BAG) IMPLANT
TUBING SMART ABLATE COOLFLOW (TUBING) IMPLANT

## 2022-08-14 NOTE — Anesthesia Procedure Notes (Signed)
Procedure Name: Intubation Date/Time: 08/14/2022 7:40 AM  Performed by: Mariann Barter, MDPre-anesthesia Checklist: Patient identified, Emergency Drugs available, Suction available and Patient being monitored Patient Re-evaluated:Patient Re-evaluated prior to induction Oxygen Delivery Method: Circle System Utilized Preoxygenation: Pre-oxygenation with 100% oxygen Induction Type: IV induction Ventilation: Mask ventilation without difficulty Laryngoscope Size: Miller and 2 Grade View: Grade II Tube type: Oral Tube size: 7.5 mm Number of attempts: 1 Airway Equipment and Method: Stylet and Oral airway Placement Confirmation: ETT inserted through vocal cords under direct vision, positive ETCO2 and breath sounds checked- equal and bilateral Tube secured with: Tape Dental Injury: Teeth and Oropharynx as per pre-operative assessment

## 2022-08-14 NOTE — Transfer of Care (Signed)
Immediate Anesthesia Transfer of Care Note  Patient: Shawn Fields  Procedure(s) Performed: ATRIAL FIBRILLATION ABLATION  Patient Location: PACU  Anesthesia Type:General  Level of Consciousness: drowsy  Airway & Oxygen Therapy: Patient Spontanous Breathing and Patient connected to face mask oxygen  Post-op Assessment: Report given to RN, Post -op Vital signs reviewed and stable, and Patient moving all extremities X 4  Post vital signs: Reviewed and stable  Last Vitals:  Vitals Value Taken Time  BP 104/67 08/14/22 0930  Temp 36.8 C 08/14/22 0928  Pulse 67 08/14/22 0932  Resp 14 08/14/22 0932  SpO2 100 % 08/14/22 0932  Vitals shown include unfiled device data.  Last Pain:  Vitals:   08/14/22 0928  TempSrc: Tympanic  PainSc: 0-No pain         Complications: There were no known notable events for this encounter.

## 2022-08-14 NOTE — Anesthesia Postprocedure Evaluation (Signed)
Anesthesia Post Note  Patient: Shawn Fields  Procedure(s) Performed: ATRIAL FIBRILLATION ABLATION     Patient location during evaluation: PACU Anesthesia Type: General Level of consciousness: awake and alert Pain management: pain level controlled Vital Signs Assessment: post-procedure vital signs reviewed and stable Respiratory status: spontaneous breathing, nonlabored ventilation, respiratory function stable and patient connected to nasal cannula oxygen Cardiovascular status: blood pressure returned to baseline and stable Postop Assessment: no apparent nausea or vomiting Anesthetic complications: no   There were no known notable events for this encounter.  Last Vitals:  Vitals:   08/14/22 1130 08/14/22 1200  BP: 109/76 110/77  Pulse: 64 67  Resp: 18 20  Temp:    SpO2: 92% 98%    Last Pain:  Vitals:   08/14/22 1015  TempSrc:   PainSc: 0-No pain                 Mariann Barter

## 2022-08-14 NOTE — Discharge Instructions (Signed)

## 2022-08-14 NOTE — H&P (Signed)
Electrophysiology Office Note   Date:  08/14/2022   ID:  Maxfield, Gildersleeve 08-05-1960, MRN [id number]  PCP:  Tally Joe, MD  Cardiologist:   Primary Electrophysiologist: Octavio Manns, MD    Chief Complaint: AF   History of Present Illness: Shawn Fields is a 62 y.o. male who is being seen today for the evaluation of AF at the request of No ref. provider found. Presenting today for electrophysiology evaluation.  He has a history significant for sleep apnea and atrial fibrillation.  He has atrial fibrillation for the last 10 years and has been on flecainide.  2 times this year, he has had episodes of atrial fibrillation.  1 of which he was playing pickle ball and another he was skiing.  Each episode lasted for 6 or 7 hours with significantly rapid heart rates.  He felt quite fatigued and short of breath during each episode.  The episode he was skiing, [place] and went into atrial fibrillation.  He gets tunnel vision when he is in atrial fibrillation.  He takes an extra dose of flecainide which eventually has converted him to sinus rhythm.  Today, denies symptoms of palpitations, chest pain, shortness of breath, orthopnea, PND, lower extremity edema, claudication, dizziness, presyncope, syncope, bleeding, or neurologic sequela. The patient is tolerating medications without difficulties. Plan ablation today.    Past Medical History:  Diagnosis Date   Allergic rhinitis    Epididymal cyst    h/o    H/O actinic keratosis    Treated sith Aldara periodically by Dr Nicholas Lose.   H/O ganglion cyst    in the left wrist that resolved.   Onychomycosis    treated w lamisil   OSA (obstructive sleep apnea)    uses CPAP nightly   PAF (paroxysmal atrial fibrillation) (HCC)    w CHADS VASC score 0-on ASA   Past Surgical History:  Procedure Laterality Date   COLONOSCOPY     HERNIA REPAIR     INGUINAL HERNIA REPAIR Right 04/22/2022   Procedure: OPEN  RIGHT INGUINAL HERNIA REPAIR WITH MESH;  Surgeon: Harriette Bouillon, MD;  Location: [facility];  Service: General;  Laterality: Right;   SHOULDER ARTHROSCOPY Right      Current Facility-Administered Medications  Medication Dose Route Frequency Provider Last Rate Last Admin   0.9 %  sodium chloride infusion   Intravenous Continuous Regan Lemming, MD 50 mL/hr at 08/14/22 0610 New Bag at 08/14/22 0610    Allergies:   Patient has no known allergies.   Social History:  The patient  reports that he has never smoked. He has never used smokeless tobacco. He reports current alcohol use. He reports that he does not use drugs.   Family History:  The patient's family history includes Arrhythmia in his brother; Heart attack in his father; Heart disease in his father; Parkinson's disease in his mother.    ROS:  Please see the history of present illness.   Otherwise, review of systems is positive for none.   All other systems are reviewed and negative.   PHYSICAL EXAM: VS:  BP 110/77   Pulse 60   Temp 97.8 F (36.6 C) (Temporal)   Resp 18   Ht 6\' 2"  (1.88 m)   Wt 81.6 kg   SpO2 97%   BMI 23.11 kg/m  , BMI Body mass index is 23.11 kg/m. GEN: Well nourished, well developed, in no acute distress  HEENT: normal  Neck: no JVD, carotid bruits, or masses Cardiac: RRR; no murmurs, rubs, or gallops,no edema  Respiratory:  clear to auscultation bilaterally, normal work of breathing GI: soft, nontender, nondistended, + BS MS: no deformity or atrophy  Skin: warm and dry Neuro:  Strength and sensation are intact Psych: euthymic mood, full affect  Recent Labs: 07/23/2022: BUN 19; Creatinine, Ser 1.24; Hemoglobin 12.7; Platelets 259; Potassium 4.3; Sodium 140    Lipid Panel     Component Value Date/Time   CHOL 164 06/11/2016 0907   TRIG 110 06/11/2016 0907   HDL 48 06/11/2016 0907   CHOLHDL 3.4 06/11/2016 0907   LDLCALC 94 06/11/2016 0907     Wt Readings from Last 3  Encounters:  08/14/22 81.6 kg  08/06/22 83.5 kg  04/22/22 82.7 kg      Other studies Reviewed: Additional studies/ records that were reviewed today include: TTE 10/27/17  Review of the above records today demonstrates:  - Left ventricle: The cavity size was normal. Wall thickness was    normal. The estimated ejection fraction was 55%. Wall motion was    normal; there were no regional wall motion abnormalities.    Features are consistent with a pseudonormal left ventricular    filling pattern, with concomitant abnormal relaxation and    increased filling pressure (grade 2 diastolic dysfunction).  - Aortic valve: There was no stenosis.  - Mitral valve: There was trivial regurgitation.  - Right ventricle: The cavity size was normal. Systolic function    was normal.  - Pulmonary arteries: No complete TR doppler jet so unable to    estimate PA systolic pressure.  - Inferior vena cava: The vessel was normal in size. The    respirophasic diameter changes were in the normal range (>= 50%),    consistent with normal central venous pressure.    ASSESSMENT AND PLAN:  1.  Paroxysmal atrial fibrillation:Chez W Dacus has presented today for surgery, with the diagnosis of AF.  The various methods of treatment have been discussed with the patient and family. After consideration of risks, benefits and other options for treatment, the patient has consented to  Procedure(s): Catheter ablation as a surgical intervention .  Risks include but not limited to complete heart block, stroke, esophageal damage, nerve damage, bleeding, vascular damage, tamponade, perforation, MI, and death. The patient's history has been reviewed, patient examined, no change in status, stable for surgery.  I have reviewed the patient's chart and labs.  Questions were answered to the patient's satisfaction.    Jaelle Campanile Elberta Fortis, MD 08/14/2022 7:06 AM

## 2022-08-14 NOTE — Anesthesia Preprocedure Evaluation (Signed)
Anesthesia Evaluation   Patient awake    Reviewed: Allergy & Precautions, NPO status , Unable to perform ROS - Chart review onlyPreop documentation limited or incomplete due to emergent nature of procedure.  History of Anesthesia Complications Negative for: history of anesthetic complications  Airway Mallampati: III  TM Distance: >3 FB Neck ROM: Full    Dental no notable dental hx.    Pulmonary sleep apnea , neg COPD   breath sounds clear to auscultation       Cardiovascular (-) hypertension(-) CAD and (-) Past MI + dysrhythmias Atrial Fibrillation  Rhythm:Regular Rate:Normal     Neuro/Psych  Headaches    GI/Hepatic ,neg GERD  ,,  Endo/Other    Renal/GU      Musculoskeletal   Abdominal   Peds  Hematology  (+) Blood dyscrasia, anemia   Anesthesia Other Findings   Reproductive/Obstetrics                              Anesthesia Physical Anesthesia Plan  ASA: 2  Anesthesia Plan: General   Post-op Pain Management:    Induction: Intravenous  PONV Risk Score and Plan: Treatment may vary due to age or medical condition  Airway Management Planned: Oral ETT  Additional Equipment:   Intra-op Plan:   Post-operative Plan:   Informed Consent: I have reviewed the patients History and Physical, chart, labs and discussed the procedure including the risks, benefits and alternatives for the proposed anesthesia with the patient or authorized representative who has indicated his/her understanding and acceptance.     Dental advisory given  Plan Discussed with: Surgeon  Anesthesia Plan Comments:          Anesthesia Quick Evaluation

## 2022-08-17 ENCOUNTER — Encounter: Payer: Self-pay | Admitting: Neurology

## 2022-08-17 ENCOUNTER — Encounter (HOSPITAL_COMMUNITY): Payer: Self-pay | Admitting: Cardiology

## 2022-08-17 DIAGNOSIS — G20A1 Parkinson's disease without dyskinesia, without mention of fluctuations: Secondary | ICD-10-CM

## 2022-08-19 ENCOUNTER — Encounter (HOSPITAL_COMMUNITY): Payer: Self-pay | Admitting: Internal Medicine

## 2022-08-19 ENCOUNTER — Ambulatory Visit (HOSPITAL_COMMUNITY)
Admission: RE | Admit: 2022-08-19 | Discharge: 2022-08-19 | Disposition: A | Discharge status: Home / Self Care | Payer: BC Managed Care – PPO | Source: Ambulatory Visit | Attending: Internal Medicine | Admitting: Internal Medicine

## 2022-08-19 ENCOUNTER — Telehealth: Payer: Self-pay | Admitting: Cardiology

## 2022-08-19 VITALS — Temp 98.0°F | Ht 74.0 in | Wt 184.0 lb

## 2022-08-19 DIAGNOSIS — I480 Paroxysmal atrial fibrillation: Secondary | ICD-10-CM | POA: Insufficient documentation

## 2022-08-19 MED ORDER — DOXYCYCLINE HYCLATE 100 MG PO CAPS: 100.0000 mg | ORAL_CAPSULE | Freq: Two times a day (BID) | ORAL | 0 refills | Status: AC

## 2022-08-19 NOTE — Telephone Encounter (Signed)
  The patient reported experiencing some swelling at his surgery site. He is requesting to speak with Dr. Gershon Crane nurse.

## 2022-08-19 NOTE — Telephone Encounter (Signed)
Pt s/p Afib ablation 08/14/2022 with Dr Elberta Fortis.  Pt complains of swelling on left side at incision site.  Area is warm and tender to the touch.  He also reports redness developing at site.  Pt states he noticed this last night.  Appointment scheduled with Afib clinic today 08/07at 2pm.  Pt verbalizes understanding and agrees with current plan.

## 2022-08-19 NOTE — Patient Instructions (Addendum)
Start Doxycycline 100mg  twice a day for the next 7 days

## 2022-08-19 NOTE — Addendum Note (Signed)
Addended by: Berna Spare A on: 08/19/2022 01:02 PM   Modules accepted: Orders

## 2022-08-19 NOTE — Progress Notes (Addendum)
Patient here for groin check s/p Afib and atrial flutter ablation by Dr. Elberta Fortis on 08/14/22. He noted since yesterday left groin redness, swelling, and tenderness to touch. He notes to have maybe walked too much yesterday - 2.5 miles. He denies any fever. No oozing of site.  Temp - 98 Left groin site tender to palpation with erythema that patient states has increased in size compared to yesterday. Compared to right groin site, left does appear to have increased swelling. Discussion on pharmacist to allow for MRSA coverage as well as reduce the frequency of dosage that would be with Keflex - will provide antibiotic coverage for possible cellulitis with doxycycline 100 mg BID x 7 days. Patient instructed to call in the next 1-2 days for any worsening signs of infection.

## 2022-08-21 ENCOUNTER — Ambulatory Visit: Payer: BC Managed Care – PPO | Attending: Cardiovascular Disease | Admitting: Cardiovascular Disease

## 2022-08-21 ENCOUNTER — Encounter: Payer: Self-pay | Admitting: Cardiovascular Disease

## 2022-08-21 VITALS — BP 140/86 | HR 80 | Ht 74.0 in | Wt 184.6 lb

## 2022-08-21 DIAGNOSIS — T148XXA Other injury of unspecified body region, initial encounter: Secondary | ICD-10-CM | POA: Diagnosis not present

## 2022-08-21 NOTE — Progress Notes (Signed)
Patient presented today as an urgent visit    He underwent atrial fibrillation with Dr. Elberta Fortis on August 2.  He was doing well until a few days ago when he noticed swelling and tenderness at the left groin.  He was seen in atrial fibrillation clinic on August 7 and prescribed doxycycline for potential access site infection.  Today, there is a small, mildly tender hematoma at the left groin.  There is a very small amount of serosanguineous drainage.  There is no expressible pus, no fluctuance. There is a nickel sized area of erythema surrounding the puncture site.  He has a mild hematoma without evidence of infection  - avoid strenuous activities for another week - monitor for symptoms of infection -- increased tenderness, spreading redness, pus drainage - he will likely experience drainage of dark serosanguinous fluid and develop a bruise - he is reassured

## 2022-08-21 NOTE — Patient Instructions (Signed)
Medication Instructions:  Your physician recommends that you continue on your current medications as directed. Please refer to the Current Medication list given to you today. *If you need a refill on your cardiac medications before your next appointment, please call your pharmacy*   Follow-Up: At [facility], you and your health needs are our priority.  As part of our continuing mission to provide you with exceptional heart care, we have created designated Provider Care Teams.  These Care Teams include your primary Cardiologist (physician) and Advanced Practice Providers (APPs -  Physician Assistants and Nurse Practitioners) who all work together to provide you with the care you need, when you need it.  We recommend signing up for the patient portal called "MyChart".  Sign up information is provided on this After Visit Summary.  MyChart is used to connect with patients for Virtual Visits (Telemedicine).  Patients are able to view lab/test results, encounter notes, upcoming appointments, etc.  Non-urgent messages can be sent to your provider as well.   To learn more about what you can do with MyChart, go to [email or web address].    Your next appointment:   Follow up af previously scheduled   Provider:   Loman Brooklyn, MD

## 2022-08-24 ENCOUNTER — Telehealth: Payer: Self-pay | Admitting: Neurology

## 2022-08-24 NOTE — Telephone Encounter (Signed)
Referral for physical therapy email to Referral to [facility] [place]. Phone:[phone], email: [email or web address]

## 2022-08-27 ENCOUNTER — Ambulatory Visit (INDEPENDENT_AMBULATORY_CARE_PROVIDER_SITE_OTHER): Payer: BC Managed Care – PPO | Admitting: Behavioral Health

## 2022-08-27 DIAGNOSIS — F4323 Adjustment disorder with mixed anxiety and depressed mood: Secondary | ICD-10-CM | POA: Diagnosis not present

## 2022-08-27 NOTE — Progress Notes (Addendum)
[facility] Counselor Initial Adult Exam  Name: Shawn Fields Date: 08/27/2022 MRN: [id number] DOB: 1960/08/09 PCP: Shawn Joe, MD  Time spent: 60 min Caregility video; Pt is home in his private Office & Provider working remotely from [facility] Office. Pt is aware of the risks/limitations of telehealth & consents to Tx today. Time In: 10:00am Time Out: 11:00am   Guardian/Payee:  BCBS COMM PPO    Paperwork requested: No   Reason for Visit /Presenting Problem: Pt has recently been Dx'd with Parkinson's Dis. He has a Hx of Afib & is also COVID + today. His health issues are hitting him hard emot'ly. His Wife Shawn Fields is an ovarian cancer survivor for 3 yrs in 2024. They have been focused on health for quite some time & want the assistance of Clinician today to gain coping skills.   Mental Status Exam: Appearance:   Casual and Neat     Behavior:  Appropriate and Sharing  Motor:  Normal  Speech/Language:   Clear and Coherent and Normal Rate  Affect:  Appropriate  Mood:  normal  Thought process:  normal  Thought content:    WNL  Sensory/Perceptual disturbances:    WNL  Orientation:  oriented to person, place, time/date, and situation  Attention:  Good  Concentration:  Good  Memory:  WNL  Fund of knowledge:   Good  Insight:    Good  Judgment:   Good  Impulse Control:  Good     Risk Assessment: Danger to Self:  No Self-injurious Behavior: No Danger to Others: No Duty to Warn:no Physical Aggression / Violence:No  Access to Firearms a concern: No  Gang Involvement:No  Patient / guardian was educated about steps to take if suicide or homicide risk level increases between visits: yes; appropriate to ICD process While future psychiatric events cannot be accurately predicted, the patient does not currently require acute inpatient psychiatric care and does not currently meet [facility] involuntary commitment criteria.  Substance Abuse History: Current  substance abuse: No     Past Psychiatric History:   No previous psychological problems have been observed Outpatient Providers: Shawn Joe, MD History of Psych Hospitalization: No  Psychological Testing:  Shawn Fields during work Hx: TJ_ _ (?).    Abuse History:  Victim of: No.,  NA    Report needed: No. Victim of Neglect:No. Perpetrator of  NA   Witness / Exposure to Domestic Violence: No   Protective Services Involvement: No  Witness to [employer] Violence:  No   Family History:  Family History  Problem Relation Age of Onset   Parkinson's disease Mother    Heart attack Father    Heart disease Father    Arrhythmia Brother     Living situation: the patient lives with their spouse  Sexual Orientation: Straight  Relationship Status: married  Name of spouse / other: Shawn Fields If a parent, number of children / ages: 2 grown [name]; 25yo Shawn Fields & 61yo Shawn Fields. 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 He receives a commission for this. Shawn Fields was an Ad Exec until the birth of their 2nd Dtr when she chose  to stay home.   Military Service: No   Educational History: Education: Engineer, maintenance (IT) from [employer]  Religion/Sprituality/World View: [place] & attend [place]  Any cultural differences that may affect / interfere with treatment:  None noted today  Recreation/Hobbies: Pt walks 10K steps/day, works w/a Psychologist, educational 2d/wk, plays [employer], Golf, Hikes, [employer], [employer] interests  Stressors: Health problems    Strengths: Supportive Relationships; Family, friends, Church  community  Barriers:  None noted today   Legal History: Pending legal issue / charges: The patient has no significant history of legal issues. History of legal issue / charges:  NA  Medical History/Surgical History: reviewed Past Medical History:  Diagnosis Date   Allergic rhinitis    Epididymal cyst    h/o    H/O actinic keratosis    Treated sith Aldara periodically by Dr Shawn Fields.   H/O ganglion cyst    in the left wrist that resolved.   Onychomycosis    treated w lamisil   OSA (obstructive sleep apnea)    uses CPAP nightly   PAF (paroxysmal atrial fibrillation) (HCC)    w CHADS VASC score 0-on ASA    Past Surgical History:  Procedure Laterality Date   ATRIAL FIBRILLATION ABLATION N/A 08/14/2022   Procedure: ATRIAL FIBRILLATION ABLATION;  Surgeon: Shawn Lemming, MD;  Location: [facility];  Service: Cardiovascular;  Laterality: N/A;   COLONOSCOPY     HERNIA REPAIR     INGUINAL HERNIA REPAIR Right 04/22/2022   Procedure: OPEN RIGHT INGUINAL HERNIA REPAIR WITH MESH;  Surgeon: Shawn Bouillon, MD;  Location: [facility];  Service: General;  Laterality: Right;   SHOULDER ARTHROSCOPY Right     Medications: Current Outpatient Medications  Medication Sig Dispense Refill   apixaban (ELIQUIS) 5 MG TABS tablet Take 1 tablet (5 mg total) by mouth 2 (two) times daily. 60 tablet 6   carbidopa-levodopa (SINEMET IR) 25-100 MG tablet Take 1 tablet by mouth 3 (three) times daily. 60 tablet 2   Efinaconazole (JUBLIA) 10 % SOLN Apply 1 application  topically daily. toe     flecainide (TAMBOCOR) 100 MG tablet TAKE 1 TABLET BY MOUTH TWICE A DAY 180 tablet 3   NON FORMULARY CPAP     rasagiline (AZILECT) 1 MG TABS tablet Take 1 tablet (1 mg total) by mouth daily. 30 tablet 5   silver sulfADIAZINE (SILVADENE) 1 % cream Apply 1 Application topically daily.     No current facility-administered medications for this visit.    No Known Allergies  Diagnoses:   Adjustment disorder with mixed anxiety and depressed mood  Plan of Care: Shawn Fields & Shawn Fields are both emot'l & upset today due to the numerous health status changes they have exp'd over the past 3 yrs. Their own Parents have been through this & now they are confronted w/disclosing Shawn Fields's Dx of Parkinson's to their 2 Adult [name]. There is a Hx of disclosing [name]'s ovarian cancer Dx 3 yrs ago that did not go well w/one Dtr & they do not want to repeat that again.  Shawn Fields's Family has a Hx of Parkinson's; his Mother died horrifically from the severe rigidity form of Parkinson's. [name] was witness to this & it scares him. His Mat GM also died of Parkinson's, he now suspects. His Father died from a massive MI @ [facility] one morning. [name] carries guilt due to denial of [name]'s request to move from a 2 1/2 Bedrm Apt earlier in life.  [name]  are living in [place] in [place]. Her Fr was Dx'd w/Alzheimer's 2 yrs ago. They spend 2-3 nights/wk visiting them.   Cpl is most worried for disclosing Shawn Fields's Dx to their [name]. They are trying to plan for this to happen when they are all tghtr, which is tricky.  Shawn Fields's goals for psychotherapy are to gain info about what to do, what to expect, how to disclose & the psychoedu needed to help them cope. They are in need of resources; provided Assoc info verbally & will mail addt'l resources via snail mail.   The Cpl is distressed over the disclosure to their [name] bc [name] is extroverted & Shawn Fields is private. She wants to limit the exposure they create due to aversive rxns from others about her ovarian cancer Dx 3 yrs ago. They are both stressed about the heritability of Parkinson's for their [name]. Shawn Fields also wants to speak w/his [name] due to the support he needs spiritually & personally.   Today, we have trouble-shot the crucial conversation & made an attempt to rehearse this & also prepare the Cpl to face any type of results when [name] are in discussion about it w/Shawn Fields &  Shawn Fields in the next few wks.   Next visit, they are free to meet w/Clinician tgthr-whatever they ea need to make this support work.Deneise Lever, LMFT

## 2022-08-27 NOTE — Progress Notes (Signed)
                Victoria L Winstead, LMFT 

## 2022-09-09 ENCOUNTER — Ambulatory Visit: Payer: BC Managed Care – PPO | Admitting: Behavioral Health

## 2022-09-11 ENCOUNTER — Ambulatory Visit (HOSPITAL_COMMUNITY): Payer: BC Managed Care – PPO | Admitting: Internal Medicine

## 2022-09-16 ENCOUNTER — Ambulatory Visit (HOSPITAL_COMMUNITY)
Admission: RE | Admit: 2022-09-16 | Discharge: 2022-09-16 | Disposition: A | Discharge status: Home / Self Care | Payer: BC Managed Care – PPO | Source: Ambulatory Visit | Attending: Internal Medicine | Admitting: Internal Medicine

## 2022-09-16 VITALS — BP 124/80 | HR 66 | Ht 74.0 in | Wt 184.2 lb

## 2022-09-16 DIAGNOSIS — Z7901 Long term (current) use of anticoagulants: Secondary | ICD-10-CM | POA: Insufficient documentation

## 2022-09-16 DIAGNOSIS — I483 Typical atrial flutter: Secondary | ICD-10-CM | POA: Diagnosis not present

## 2022-09-16 DIAGNOSIS — Z79899 Other long term (current) drug therapy: Secondary | ICD-10-CM | POA: Diagnosis not present

## 2022-09-16 DIAGNOSIS — I480 Paroxysmal atrial fibrillation: Secondary | ICD-10-CM | POA: Diagnosis not present

## 2022-09-16 NOTE — Progress Notes (Signed)
Primary Care Physician: Tally Joe, MD Primary Cardiologist: None Electrophysiologist: Lewayne Bunting, MD     Referring Physician: Dr. Gae Bon Shawn Fields is a 62 y.o. male with a history of OSA on CPAP, Parkinson's disease, atrial flutter, and paroxysmal atrial fibrillation who presents for consultation in the [facility]. S/p Afib and atrial flutter ablation on 08/14/22 by Dr. Elberta Fortis. Patient is on Eliquis for a CHADS2VASC score of zero.  On follow up today, he is currently in NSR. No episodes of Afib since ablation on 08/14/22 by Dr. Elberta Fortis. Seen by me on 8/7 and started on doxycycline for concern of cellulitis. He saw Dr. Nelly Laurence 2 days later and reassured to continue antibiotic and hematoma recommendations. No chest pain, SOB, or trouble swallowing. Leg sites healed without issue. He is currently on flecainide 100 mg BID. No missed doses of anticoagulant.  Today, he denies symptoms of orthopnea, PND, lower extremity edema, dizziness, presyncope, syncope, snoring, daytime somnolence, bleeding, or neurologic sequela. The patient is tolerating medications without difficulties and is otherwise without complaint today.    he has a BMI of Body mass index is 23.65 kg/m.Marland Kitchen Filed Weights   09/16/22 1433  Weight: 83.6 kg    Current Outpatient Medications  Medication Sig Dispense Refill   apixaban (ELIQUIS) 5 MG TABS tablet Take 1 tablet (5 mg total) by mouth 2 (two) times daily. 60 tablet 6   carbidopa-levodopa (SINEMET IR) 25-100 MG tablet Take 1 tablet by mouth 3 (three) times daily. 60 tablet 2   Efinaconazole (JUBLIA) 10 % SOLN Apply 1 application  topically daily. toe     flecainide (TAMBOCOR) 100 MG tablet TAKE 1 TABLET BY MOUTH TWICE A DAY 180 tablet 3   NON FORMULARY CPAP     rasagiline (AZILECT) 1 MG TABS tablet Take 1 tablet (1 mg total) by mouth daily. 30 tablet 5   silver sulfADIAZINE (SILVADENE) 1 % cream Apply 1 Application topically  daily.     No current facility-administered medications for this encounter.    Atrial Fibrillation Management history:  Previous antiarrhythmic drugs: flecainide Previous cardioversions: none Previous ablations: 08/14/22 Anticoagulation history: Eliquis   ROS- All systems are reviewed and negative except as per the HPI above.  Physical Exam: BP 124/80   Pulse 66   Ht 6\' 2"  (1.88 m)   Wt 83.6 kg   BMI 23.65 kg/m   GEN: Well nourished, well developed in no acute distress NECK: No JVD; No carotid bruits CARDIAC: Regular rate and rhythm, no murmurs, rubs, gallops RESPIRATORY:  Clear to auscultation without rales, wheezing or rhonchi  ABDOMEN: Soft, non-tender, non-distended EXTREMITIES:  No edema; No deformity   EKG today demonstrates  Vent. rate 66 BPM PR interval 182 ms QRS duration 102 ms QT/QTcB 414/434 ms P-R-T axes -4 -15 -12 Normal sinus rhythm Normal ECG When compared with ECG of 14-Aug-2022 09:42, PREVIOUS ECG IS PRESENT  Echo 10/27/17 demonstrated  Study Conclusions   - Left ventricle: The cavity size was normal. Wall thickness was    normal. The estimated ejection fraction was 55%. Wall motion was    normal; there were no regional wall motion abnormalities.    Features are consistent with a pseudonormal left ventricular    filling pattern, with concomitant abnormal relaxation and    increased filling pressure (grade 2 diastolic dysfunction).  - Aortic valve: There was no stenosis.  - Mitral valve: There was trivial regurgitation.  - Right ventricle:  The cavity size was normal. Systolic function    was normal.  - Pulmonary arteries: No complete TR doppler jet so unable to    estimate PA systolic pressure.  - Inferior vena cava: The vessel was normal in size. The    respirophasic diameter changes were in the normal range (>= 50%),    consistent with normal central venous pressure.   ASSESSMENT & PLAN CHA2DS2-VASc Score = 0  The patient's score is based  upon: CHF History: 0 HTN History: 0 Diabetes History: 0 Stroke History: 0 Vascular Disease History: 0 Age Score: 0 Gender Score: 0       ASSESSMENT AND PLAN: Paroxysmal Atrial Fibrillation (ICD10:  I48.0) / Typical atrial flutter The patient's CHA2DS2-VASc score is 0, indicating a 0.2% annual risk of stroke.   S/p Afib and atrial flutter ablation on 08/14/22 by Dr. Elberta Fortis.  Patient is currently in NSR. Groin sites have healed without subsequent issue. He is back to activity as tolerated without issue.   Continue flecainide 100 mg BID. Continue Eliquis 5 mg BID.      Follow up as scheduled with Dr. Elberta Fortis.    [place] Bells, PA-C  Afib Clinic [facility] [address] [place], [place] [address] [phone]

## 2022-09-28 ENCOUNTER — Ambulatory Visit (INDEPENDENT_AMBULATORY_CARE_PROVIDER_SITE_OTHER): Payer: BC Managed Care – PPO | Admitting: Behavioral Health

## 2022-09-28 DIAGNOSIS — F4323 Adjustment disorder with mixed anxiety and depressed mood: Secondary | ICD-10-CM

## 2022-09-28 NOTE — Progress Notes (Unsigned)
                Shawn Jewell L Farryn Linares, LMFT 

## 2022-09-30 NOTE — Progress Notes (Signed)
[facility] Counselor/Therapist Progress Note  Patient ID: Shawn Fields, MRN: [id number],    Date: 09/30/2022  Time Spent: 55 min In Person @ [facility] Office Time In: 11:00am Time Out: 11:55am  Treatment Type: Individual Therapy  Reported Symptoms: elevated anx/dep & stress due to crisis of initial Dx of Parkinson's Dis. Pt has a Family Hx of this disease that is severe. There have been major Family issues surrounding this Family Hx & Pt & Wife are Fields dealing w/health status changes w/early-onset.   Mental Status Exam: Appearance:  Casual and Neat     Behavior: Appropriate and Sharing  Motor: Normal  Speech/Language:  Clear and Coherent and Normal Rate  Affect: Appropriate  Mood: anxious  Thought process: normal  Thought content:   WNL  Sensory/Perceptual disturbances:   WNL  Orientation: oriented to person, place, time/date, and situation  Attention: Good  Concentration: Good  Memory: WNL  Fund of knowledge:  Good  Insight:   Good  Judgment:  Good  Impulse Control: Good   Risk Assessment: Danger to Self:  No Self-injurious Behavior: No Danger to Others: No Duty to Warn:no Physical Aggression / Violence:No  Access to Firearms a concern: No  Gang Involvement:No   Subjective: Pt sts he & Wife Shawn Fields were able to speak w/Fields Shawn Fields over a [employer] & disclose his health status changes & the Dx of Parkinson's Dis. It was very emot'l, but Shawn Fields handled it well. They have since been more communicative & attentive to Pt. The younger Dtr Shawn Fields was very clinical in her beh over Mom's Dx of ovarian cancer. She has greater tendencies to be anxious than older Shawn Fields.   Shawn Fields's Parents are living @ [name] in [place]. They are in Indep Living currently, & rely heavily on Pt & Wife to get social time w/others. Pt desires they promote new relationships & activities offered @ [name], but this has yet to happen. Shawn Fields's Mom finds it  difficult to support her emot'ly, esp'ly through illness.   Pt is focused on his Mother's exp of Parkinson's in the last 12 yrs of life. Her health status changes were devastating to the Family; it was designated as Parkinson's Plus in severity. She required Nsg care 24/7 due to the use of a G-Tube for feedings, her lung issues & excess saliva excretion.   Pt finds good support in his Shawn Fields, who is also a good friend & will remain so. His Mother's love lang was food & the Family's Hx of Gatherings was predominant in their Family Culture. His Shawn & Bros express similar sentiments as he does; Parents did not provide a great deal of emot'l/"Love You" support. The affection was lacking & this is a hard Chief Technology Officer. [name] died of an MI @ the age of 77yo.   Pt & Wife have a complex Hx, some issues inlv'g Cslg for Shawn Fields's insecurities about him having an Affair-which would repeat her own Father's beh who had an [place] for 25 yrs & a [name] is now grown.   Pt has recently undergone an Ablation procedure for his A Fib. He was hopeful this would make him free of medications, but now, he will need meds for Parkinson's.  Pt has been tearful throughout this process in therapy today & vulnerable w/his feelings & own concerns for his Family.  Interventions: Psycho-education/Bibliotherapy and Family Systems regarding the initial crisis of Dx of a major health status change w/early-onset  Diagnosis:Adjustment  disorder with mixed anxiety and depressed mood  Plan: Shawn Fields is tearful & exp'g extreme sadness today. The picture of health in his FOO & his Wife's health status have created much anxiety & disruption of their previous plans for life together. He will prioritize his needs in Therapy so we can adjust & focus on what he feels is important. He will use a Notebook btwn sessions to record his thoughts & feelings to focus on in our visits.   Target Date: 10/27/2022  Progress: 4  Frequency: Once every 2-3  wks  Modality: Dimple Casey feels he wants another session individually prior to asking Shawn Fields to join our sessions. He will take this process one step @ a time & be open to inviting her as the situation indicates.   Target Date: 1015/2024  Progress: 3  Frequency: Once every 2-3 wks as Pt travel schedule allows  Modality: Shawn Fraise, LMFT

## 2022-10-12 ENCOUNTER — Ambulatory Visit: Payer: BC Managed Care – PPO | Admitting: Behavioral Health

## 2022-10-14 ENCOUNTER — Other Ambulatory Visit: Payer: Self-pay | Admitting: Neurology

## 2022-10-20 DIAGNOSIS — G20A1 Parkinson's disease without dyskinesia, without mention of fluctuations: Secondary | ICD-10-CM | POA: Diagnosis not present

## 2022-10-21 ENCOUNTER — Ambulatory Visit (INDEPENDENT_AMBULATORY_CARE_PROVIDER_SITE_OTHER): Payer: BC Managed Care – PPO | Admitting: Behavioral Health

## 2022-10-21 DIAGNOSIS — G20A1 Parkinson's disease without dyskinesia, without mention of fluctuations: Secondary | ICD-10-CM | POA: Diagnosis not present

## 2022-10-21 DIAGNOSIS — H04552 Acquired stenosis of left nasolacrimal duct: Secondary | ICD-10-CM | POA: Diagnosis not present

## 2022-10-21 DIAGNOSIS — H04222 Epiphora due to insufficient drainage, left lacrimal gland: Secondary | ICD-10-CM | POA: Diagnosis not present

## 2022-10-21 DIAGNOSIS — H0279 Other degenerative disorders of eyelid and periocular area: Secondary | ICD-10-CM | POA: Diagnosis not present

## 2022-10-21 DIAGNOSIS — F4323 Adjustment disorder with mixed anxiety and depressed mood: Secondary | ICD-10-CM | POA: Diagnosis not present

## 2022-10-21 DIAGNOSIS — H04412 Chronic dacryocystitis of left lacrimal passage: Secondary | ICD-10-CM | POA: Diagnosis not present

## 2022-10-21 NOTE — Progress Notes (Signed)
                Anastasya Jewell L Farryn Linares, LMFT 

## 2022-10-28 NOTE — Progress Notes (Signed)
[facility] Counselor/Therapist Progress Note  Patient ID: Shawn Fields, MRN: [id number],    Date: 10/21/2022  Time Spent: 55 min In Person @ [facility] Time In: 11:00am Time Out: 11:55am   Treatment Type: Individual Therapy  Reported Symptoms: Elevated anx/dep & emotionality since his Dx of Parkinson's.   Mental Status Exam: Appearance:  Neat     Behavior: Appropriate and Sharing  Motor: Normal  Speech/Language:  Clear and Coherent; fast-paced  Affect: Appropriate  Mood: anxious  Thought process: normal  Thought content:   WNL  Sensory/Perceptual disturbances:   WNL  Orientation: oriented to person and place, time & situation  Attention: Good  Concentration: Good  Memory: WNL  Fund of knowledge:  Good  Insight:   Good  Judgment:  Good  Impulse Control: Good   Risk Assessment: Danger to Self:  No Self-injurious Behavior: No Danger to Others: No Duty to Warn:no Physical Aggression / Violence:No  Access to Firearms a concern: No  Gang Involvement:No   Subjective: Pt is avidly taking notes today. He reports he is being intentional about who he shares his Dx with & who he declines to tell. His Physician has related he does not have Atypical Parkinson's Dis like his Mother. This has dec'd his anxiety. Pt shared his fears & the things he has remembered in the past few yrs that were indicative of the disease.   Pt feels greater support from his [name]-ea being themselves & contacting Parents more. He is proud of his [name]. He cont's to exer, sleep well & shares his fears & vulnerabilities w/his Wife [name] now-more than in the past when he was there for her in the midst of her Ovarian Cancer. Pt has determined some of his closest friends are there for him & Family.  Interventions: Narrative  Diagnosis:Adjustment disorder with mixed anxiety and depressed mood  Parkinson's disease without dyskinesia or fluctuating manifestations (HCC)  Plan: [name] is  feeling stronger & more positive about his Dx. He has reached out to a few select ppl about his Dx & is sharing the news w/care for himself, his Wife & his [name]. Encouraged Pt to share health news @ his discretion as PHI is private. Discussed how [name] is in charge of writing his own illness narrative. This can be empowering & powerful.   Target Date: 11/27/2022  Progress: 5  Frequency: Once every 2-3 wks  Modality: Claretta Fraise, LMFT

## 2022-11-04 ENCOUNTER — Telehealth: Payer: Self-pay | Admitting: Neurology

## 2022-11-04 NOTE — Telephone Encounter (Signed)
LVM and sent mychart msg informing pt of need to reschedule 11/30/22 appt - MD Out

## 2022-11-10 ENCOUNTER — Ambulatory Visit: Payer: BC Managed Care – PPO | Admitting: Behavioral Health

## 2022-11-26 ENCOUNTER — Ambulatory Visit: Payer: BC Managed Care – PPO | Attending: Cardiology | Admitting: Cardiology

## 2022-11-26 ENCOUNTER — Encounter: Payer: Self-pay | Admitting: Cardiology

## 2022-11-26 VITALS — BP 132/80 | HR 67 | Ht 74.0 in | Wt 185.0 lb

## 2022-11-26 DIAGNOSIS — I480 Paroxysmal atrial fibrillation: Secondary | ICD-10-CM | POA: Diagnosis not present

## 2022-11-26 DIAGNOSIS — G4733 Obstructive sleep apnea (adult) (pediatric): Secondary | ICD-10-CM

## 2022-11-26 MED ORDER — DILTIAZEM HCL 30 MG PO TABS: 30.0000 mg | ORAL_TABLET | Freq: Every day | ORAL | 1 refills | Status: DC | PRN

## 2022-11-26 MED ORDER — FLECAINIDE ACETATE 100 MG PO TABS: 100.0000 mg | ORAL_TABLET | Freq: Every day | ORAL | Status: DC | PRN

## 2022-11-26 NOTE — Patient Instructions (Addendum)
Medication Instructions:  Your physician has recommended you make the following change in your medication:  STOP Flecainide  You may take Flecainide 100 mg & Diltiazem 30 mg  AS NEEDED for breakthrough episodes.  *If you need a refill on your cardiac medications before your next appointment, please call your pharmacy*   Lab Work: None ordered   Testing/Procedures: None ordered   Follow-Up: At [facility], you and your health needs are our priority.  As part of our continuing mission to provide you with exceptional heart care, we have created designated Provider Care Teams.  These Care Teams include your primary Cardiologist (physician) and Advanced Practice Providers (APPs -  Physician Assistants and Nurse Practitioners) who all work together to provide you with the care you need, when you need it.  Your next appointment:   6 month(s)  The format for your next appointment:   In Person  Provider:   Loman Brooklyn, MD    Thank you for choosing [facility]!!   Dory Horn, RN [phone]

## 2022-11-26 NOTE — Progress Notes (Signed)
  Electrophysiology Office Note:   Date:  11/26/2022  ID:  Shawn Fields, DOB 1960-09-23, MRN [id number]  Primary Cardiologist: None Electrophysiologist: Lewayne Bunting, MD      History of Present Illness:   Shawn Fields is a 62 y.o. male with h/o Parkinson's disease, atrial fibrillation/flutter post ablation 8-24, sleep apnea seen today for routine electrophysiology followup.   Since last being seen in our clinic the patient reports doing well.  Since his ablation, he has had no further episodes of atrial fibrillation.  He continues to exercise, and has started boxing.  He has been diagnosed with Parkinson's, but is on minimal medications.  he denies chest pain, palpitations, dyspnea, PND, orthopnea, nausea, vomiting, dizziness, syncope, edema, weight gain, or early satiety.   Review of systems complete and found to be negative unless listed in HPI.   EP Information / Studies Reviewed:    EKG is ordered today. Personal review as below.  EKG Interpretation Date/Time:  Thursday November 26 2022 15:03:50 EST Ventricular Rate:  67 PR Interval:  172 QRS Duration:  106 QT Interval:  408 QTC Calculation: 431 R Axis:   78  Text Interpretation: Normal sinus rhythm Normal ECG When compared with ECG of 16-Sep-2022 14:43, Questionable change in QRS axis Non-specific change in ST segment in Inferior leads T wave inversion no longer evident in Inferior leads Confirmed by Shellye Zandi ([id number]) on 11/26/2022 3:08:43 PM     Risk Assessment/Calculations:    CHA2DS2-VASc Score = 0   This indicates a 0.2% annual risk of stroke. The patient's score is based upon: CHF History: 0 HTN History: 0 Diabetes History: 0 Stroke History: 0 Vascular Disease History: 0 Age Score: 0 Gender Score: 0              Physical Exam:   VS:  BP 132/80 (BP Location: Right Arm, Patient Position: Sitting, Cuff Size: Normal)   Pulse 67   Ht 6\' 2"  (1.88 m)   Wt 185 lb (83.9 kg)   SpO2 98%   BMI 23.75 kg/m     Wt Readings from Last 3 Encounters:  11/26/22 185 lb (83.9 kg)  09/16/22 184 lb 3.2 oz (83.6 kg)  08/21/22 184 lb 9.6 oz (83.7 kg)     GEN: Well nourished, well developed in no acute distress NECK: No JVD; No carotid bruits CARDIAC: Regular rate and rhythm, no murmurs, rubs, gallops RESPIRATORY:  Clear to auscultation without rales, wheezing or rhonchi  ABDOMEN: Soft, non-tender, non-distended EXTREMITIES:  No edema; No deformity   ASSESSMENT AND PLAN:    1.  Paroxysmal atrial fibrillation/flutter: Currently on flecainide.  Post ablation 08/14/2022.  He has not had further episodes of atrial fibrillation since ablation.  Happy with his control.  Shawn Fields stop flecainide today.  2.  Obstructive apnea: CPAP compliance encouraged  Follow up with [facility] Clinic in 6 months  Signed, Shawn Ostergaard Jorja Loa, MD

## 2022-11-30 ENCOUNTER — Ambulatory Visit: Payer: BC Managed Care – PPO | Admitting: Neurology

## 2022-12-01 ENCOUNTER — Ambulatory Visit (INDEPENDENT_AMBULATORY_CARE_PROVIDER_SITE_OTHER): Payer: BC Managed Care – PPO | Admitting: Behavioral Health

## 2022-12-01 DIAGNOSIS — F4323 Adjustment disorder with mixed anxiety and depressed mood: Secondary | ICD-10-CM | POA: Diagnosis not present

## 2022-12-01 DIAGNOSIS — G20A1 Parkinson's disease without dyskinesia, without mention of fluctuations: Secondary | ICD-10-CM

## 2022-12-01 NOTE — Progress Notes (Unsigned)
                Shawn Fields L Farryn Linares, LMFT 

## 2022-12-01 NOTE — Progress Notes (Addendum)
[facility] Counselor/Therapist Progress Note  Patient ID: Shawn Fields, MRN: [id number],    Date: 12/01/2022  Time Spent: 55 min In Person @ [facility] Office Time In: 2:00pm Time Out: 2:55pm   Treatment Type:  Cpl Th  Reported Symptoms: Elevated anx/dep & stress due to CA125 elevation on Wife [name]'s recent Scan. It was repeated today & was elevated a month ago. She is not positive about the outcome today. Pt is upset over this recent turn of events & Cpl is struggling.   Mental Status Exam: Appearance:  Casual & neat  Behavior: Appropriate and Sharing  Motor: Normal  Speech/Language:  Clear and Coherent  Affect: Appropriate  Mood: anxious  Thought process: normal  Thought content:   WNL  Sensory/Perceptual disturbances:   WNL  Orientation: oriented to person, place, time/date, and situation  Attention: Good  Concentration: Good  Memory: WNL  Fund of knowledge:  Good  Insight:   Good  Judgment:  Good  Impulse Control: Good   Risk Assessment: Danger to Self:  No Self-injurious Behavior: No Danger to Others: No Duty to Warn:no Physical Aggression / Violence:No  Access to Firearms a concern: No  Gang Involvement:No   Subjective: Pt & Wife are here today just out of an appt for a Scan for Wife. They are both upset, expressing the difficulty of knowing Test results on MyChart prior to the call from a Provider. It has been a challenge to first share the Dx of Parkinson's w/their 2 [name], then w/Pt's Siblings @ the [place], & now other Family members over the [place].    Pt is fearful he will be looked @ differently by anyone he tells-esp'ly Co-Workers & Clients. [name] has been careful in her disclosures since th initial Dx of her Breast Cancer. She is worried for her [name] & now giving them more bad news.   Interventions: Family Systems & process of sharing PHI w/family & friends  Diagnosis:Adjustment disorder with mixed anxiety and depressed  mood  Parkinson's disease without dyskinesia or fluctuating manifestations (HCC)  Plan: Glori Bickers discussed their needs for privacy w/PHI this Fall & the challenges they have faced telling others. The expectations they hold are not all realistic & we processed some of these today as they struggle to understand other ppl's beh & lack of support. The Elesa Hacker has been especially difficult for [name] as being on a Prayer List has been disappointing. Ppl do not often know what to say & say nothing. At other times, ppl say the wrong things or things that seem trite & insensitive. They can understand both approaches. Discussed the nature of how PHI is shared in both Families generally. Encouraged [name] & [name] to think about the boundaries they wish to estb now, as later may pose different issues-either way they can surmount these challenges & create healthy situations for both of them with others.   Emphasized to Cpl how sensitive they have been thus far & the nature of them feeling as if they are taking care of everyone else. Stressed to Cpl this is a pivotal time when they may need to be selfish & do what is best for themselves first. Cpl acknowledged & agreed. We will meet again tgthr to assist them w/these Family issues around health & illness.   Target Date: 12/27/2022  Progress: 6  Frequency: Once every 2-3 wks  Modality: Cpl Th    Deneise Lever, LMFT

## 2022-12-21 ENCOUNTER — Other Ambulatory Visit: Payer: Self-pay | Admitting: Cardiology

## 2022-12-23 ENCOUNTER — Ambulatory Visit: Payer: BC Managed Care – PPO | Admitting: Behavioral Health

## 2022-12-23 DIAGNOSIS — G20A1 Parkinson's disease without dyskinesia, without mention of fluctuations: Secondary | ICD-10-CM | POA: Diagnosis not present

## 2022-12-23 DIAGNOSIS — F4323 Adjustment disorder with mixed anxiety and depressed mood: Secondary | ICD-10-CM | POA: Diagnosis not present

## 2022-12-23 NOTE — Progress Notes (Signed)
                Anastasya Jewell L Farryn Linares, LMFT 

## 2022-12-23 NOTE — Progress Notes (Unsigned)
[facility] Counselor/Therapist Progress Note  Patient ID: Shawn Fields, MRN: [id number],    Date: 12/23/2022  Time Spent: ***   Treatment Type: {CHL AMB THERAPY TYPES:[phone]}  Reported Symptoms: ***  Mental Status Exam: Appearance:  {PSY:22683}     Behavior: {PSY:21022743}  Motor: {PSY:22302}  Speech/Language:  {PSY:22685}  Affect: {PSY:22687}  Mood: {PSY:31886}  Thought process: {PSY:31888}  Thought content:   {PSY:8580930516}  Sensory/Perceptual disturbances:   {PSY:5855647661}  Orientation: {PSY:30297}  Attention: {PSY:22877}  Concentration: {PSY:412 240 3693}  Memory: {PSY:567-511-9972}  Fund of knowledge:  {PSY:412 240 3693}  Insight:   {PSY:412 240 3693}  Judgment:  {PSY:412 240 3693}  Impulse Control: {PSY:412 240 3693}   Risk Assessment: Danger to Self:  {PSY:22692} Self-injurious Behavior: {PSY:22692} Danger to Others: {PSY:22692} Duty to Warn:{PSY:311194} Physical Aggression / Violence:{PSY:21197} Access to Firearms a concern: {PSY:21197} Gang Involvement:{PSY:21197}  Subjective: ***   Interventions: {PSY:540-024-0051}  Diagnosis:No diagnosis found.  Plan: ***  Deneise Lever, LMFT

## 2022-12-31 ENCOUNTER — Ambulatory Visit: Payer: BC Managed Care – PPO | Admitting: Neurology

## 2023-01-21 DIAGNOSIS — E7800 Pure hypercholesterolemia, unspecified: Secondary | ICD-10-CM | POA: Diagnosis not present

## 2023-01-21 DIAGNOSIS — Z125 Encounter for screening for malignant neoplasm of prostate: Secondary | ICD-10-CM | POA: Diagnosis not present

## 2023-02-02 ENCOUNTER — Encounter: Payer: Self-pay | Admitting: Neurology

## 2023-02-02 ENCOUNTER — Ambulatory Visit (INDEPENDENT_AMBULATORY_CARE_PROVIDER_SITE_OTHER): Payer: BC Managed Care – PPO | Admitting: Neurology

## 2023-02-02 VITALS — BP 121/72 | HR 75 | Ht 75.0 in | Wt 185.4 lb

## 2023-02-02 DIAGNOSIS — G478 Other sleep disorders: Secondary | ICD-10-CM | POA: Diagnosis not present

## 2023-02-02 DIAGNOSIS — I480 Paroxysmal atrial fibrillation: Secondary | ICD-10-CM

## 2023-02-02 DIAGNOSIS — G4733 Obstructive sleep apnea (adult) (pediatric): Secondary | ICD-10-CM | POA: Diagnosis not present

## 2023-02-02 DIAGNOSIS — G20A1 Parkinson's disease without dyskinesia, without mention of fluctuations: Secondary | ICD-10-CM | POA: Diagnosis not present

## 2023-02-02 DIAGNOSIS — Z820 Family history of epilepsy and other diseases of the nervous system: Secondary | ICD-10-CM

## 2023-02-02 NOTE — Progress Notes (Signed)
Provider:  Melvyn Novas, Fields  Primary Care Physician:  Shawn Joe, Fields [phone] Shawn Fields [address] [place] [address]     Referring Provider: Tally Joe, Fields [address] [place],  [place] [address]          Chief Complaint according to patient   Patient presents with:     New Patient (Initial Visit)           HISTORY OF PRESENT ILLNESS:  Shawn Fields is a 63 y.o. male patient who is here for revisit 02/02/2023 for  OSA on CPAP, adapt health. We were unable to gather data for compliance. He assures me he uses the machine 100% of days, and he has a travel machine.  I asked today to be tagged to his machine by serial number for therapeutic data.  Number 2) very early manifestations of Parkinson's Disease , positive DAT  scan ,  he weaned off several of the medications I had started upon Dr Shawn Fields recommendation and feels good, he is boxing, steady boxing for fall prevention.   Mother had akinetic rigid form of PD> he is motivated to Archivist.  He is seeing a counselor to deal with his dx and with his wife ovarian cancer recurrence, surgery on Christmas 2024, which she went through with sprite.   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    Shawn Fields is a 63 y.o. year old White or Caucasian male patient seen upon referral on  from primary neurologist Dr. Pearlean Brownie, Fields  for a sleep consultation on a patient that already is established with Dr. Arlee Fields clinic, followed for CPAP and for atrial fibrillation ( onset in 2013) .    RV 07/24/2021:  patient here with wife - this to discuss the sleep study that they feel is not reflecting their concerns-  First of all his wife witnessed many movement events in his sleep , upper body - not necessarily a regular , rhythmic pattern- but a myoclonic jerking.  Shoulder pain is also present. Right shoulder, he now sleep on the left, uses CPAP .  He has constant pain.  He lost  his sense of smell-  we did perform a MOCA- 28/ 30 points , normal.      Older brother has atrial fibrillation, father died with a massive heart attack. That let to his sleep study,and the patient now presents with concerns of waking stiff, rigid. His mother a had akinetic PD. Right arm dominant, while his mother had been left handed. .     Chief concern according to patient : see above -  I have the pleasure of seeing Shawn Fields today, a right-handed White or Caucasian male with a known sleep disorder and actively followed for sleep by Shawn Fields.  Here upon referral of Dr Shawn Brownie, Fields -    Shawn Fields is a 63 y.o. male patient who is here for revisit 08/06/2022 for Test results .  Chief concern according to patient :  Tomorrow cardiac CT calcium scan, ablation this Friday,/  has not looked at results of recent tests. DAT scan . This was ordered by me. Right mild carpal tunnel wa seen on NCV and EMG>  DAT scan abnormal " left decreased Dopamine uptake ,  And this is for me diagnostic  I want him to undergo ablation and stabilize before he should start with PD  directed exercise.    He will start on sinemet 25/ 100 Mg  His mother had an atypical PD, was followed by Shawn Fields,   he would like to be referred to him.  He will start with Parkinson's Disease exercise with ACT.        Shawn Fields is a 63 y.o. male patient who is here for revisit 05/21/2022 for  a new problem .  Chief concern according to patient :  " I have been having trouble with my right arm after shoulder surgery  for shoulder bone spurs, had normal joint and rotator cuff function . His hand is now closed, he lost arm swing,  more stiffness.  He also has a fisted hand  during sleep. Needs to prop the arm up during the night. His wife has observed sudden  jerking movements and he feels as if he has received an electric shock. On Fleccanaide for atrial fib, now scheduling an ablation.    Shawn Fields also provided me  with a CPAP download he is on a set CPAP pressure of 7 cm water without expiratory pressure relief and has been 87% compliant this means 26 out of 30 days of use.  The average time is 8 hours and 26 minutes of CPAP use each night and his residual AHI or apnea hypopnea index is 2.8/h.  Among those residual apneas are more central than obstructive apneas.  He has a high air leakage the 95th percentile is 37.8 L a minute and I do wonder if he needs a better fitting mask or an adjustment. I continued today's visit with a full neurologic examination.        Review of Systems: Out of a complete 14 system review, the patient complains of only the following symptoms, and all other reviewed systems are negative.:  Fatigue, sleepiness , snoring, all improved under CPAP. CPAP was followed by cardiology and HST was done through cardiology.      How likely are you to doze in the following situations: 0 = not likely, 1 = slight chance, 2 = moderate chance, 3 = high chance   Sitting and Reading? Watching Television? Sitting inactive in a public place (theater or meeting)? As a passenger in a car for an hour without a break? Lying down in the afternoon when circumstances permit? Sitting and talking to someone? Sitting quietly after lunch without alcohol? In a car, while stopped for a few minutes in traffic?   Total = 5 2/ 24 points   FSS endorsed at 15/ 63 points.   Social History   Socioeconomic History   Marital status: Married    Spouse name: Shawn Fields   Number of children: 2   Years of education: 16   Highest education level: Bachelor's degree (e.g., BA, AB, BS)  Occupational History   Occupation: [employer]   Tobacco Use   Smoking status: Never   Smokeless tobacco: Never  Vaping Use   Vaping status: Never Used  Substance and Sexual Activity   Alcohol use: Yes    Comment: social   Drug use: No   Sexual activity: Yes  Other Topics Concern   Not on file  Social History Narrative    Lives at home w wife   Drinks 1 cup a caffeine a day    Left handed   Social Drivers of Corporate investment banker Strain: Not on file  Food Insecurity: Not on file  Transportation Needs: Not on file  Physical  Activity: Not on file  Stress: Not on file  Social Connections: Not on file    Family History  Problem Relation Age of Onset   Parkinson's disease Mother    Heart attack Father    Heart disease Father    Arrhythmia Brother     Past Medical History:  Diagnosis Date   Allergic rhinitis    Epididymal cyst    h/o    H/O actinic keratosis    Treated sith Aldara periodically by Dr Nicholas Lose.   H/O ganglion cyst    in the left wrist that resolved.   Onychomycosis    treated w lamisil   OSA (obstructive sleep apnea)    uses CPAP nightly   PAF (paroxysmal atrial fibrillation) (HCC)    w CHADS VASC score 0-on ASA    Past Surgical History:  Procedure Laterality Date   ATRIAL FIBRILLATION ABLATION N/A 08/14/2022   Procedure: ATRIAL FIBRILLATION ABLATION;  Surgeon: Regan Lemming, Fields;  Location: [facility];  Service: Cardiovascular;  Laterality: N/A;   COLONOSCOPY     HERNIA REPAIR     INGUINAL HERNIA REPAIR Right 04/22/2022   Procedure: OPEN RIGHT INGUINAL HERNIA REPAIR WITH MESH;  Surgeon: Harriette Bouillon, Fields;  Location: [facility];  Service: General;  Laterality: Right;   SHOULDER ARTHROSCOPY Right      Current Outpatient Medications on File Prior to Visit  Medication Sig Dispense Refill   Efinaconazole (JUBLIA) 10 % SOLN Apply 1 application  topically daily. toe     NON FORMULARY CPAP     rasagiline (AZILECT) 1 MG TABS tablet Take 1 tablet (1 mg total) by mouth daily. 30 tablet 5   silver sulfADIAZINE (SILVADENE) 1 % cream Apply 1 Application topically daily.     apixaban (ELIQUIS) 5 MG TABS tablet Take 1 tablet (5 mg total) by mouth 2 (two) times daily. 60 tablet 6   carbidopa-levodopa (SINEMET IR) 25-100 MG tablet TAKE 1 TABLET BY  MOUTH THREE TIMES A DAY (Patient not taking: Reported on 11/26/2022) 60 tablet 2   diltiazem (CARDIZEM) 30 MG tablet TAKE 1 TABLET (30 MG TOTAL) BY MOUTH DAILY AS NEEDED (FOR AFLUTTER/AFIB). (Patient not taking: Reported on 02/02/2023) 90 tablet 3   flecainide (TAMBOCOR) 100 MG tablet Take 1 tablet (100 mg total) by mouth daily as needed. (Patient not taking: Reported on 02/02/2023)     No current facility-administered medications on file prior to visit.    No Known Allergies   DIAGNOSTIC DATA (LABS, IMAGING, TESTING) - I reviewed patient records, labs, notes, testing and imaging myself where available.  Lab Results  Component Value Date   WBC 6.0 07/23/2022   HGB 12.7 (L) 07/23/2022   HCT 38.2 07/23/2022   MCV 92 07/23/2022   PLT 259 07/23/2022      Component Value Date/Time   NA 140 07/23/2022 1402   K 4.3 07/23/2022 1402   CL 103 07/23/2022 1402   CO2 24 07/23/2022 1402   GLUCOSE 95 07/23/2022 1402   GLUCOSE 79 12/16/2007 1231   BUN 19 07/23/2022 1402   CREATININE 1.24 07/23/2022 1402   CALCIUM 9.1 07/23/2022 1402   GFRNONAA >60 12/16/2007 1231   GFRAA  12/16/2007 1231    >60        The eGFR has been calculated using the MDRD equation. This calculation has not been validated in all clinical   Lab Results  Component Value Date   CHOL 164 06/11/2016   HDL 48  06/11/2016   LDLCALC 94 06/11/2016   TRIG 110 06/11/2016   CHOLHDL 3.4 06/11/2016   Lab Results  Component Value Date   HGBA1C 5.3 07/30/2014   No results found for: "VITAMINB12" No results found for: "TSH"  PHYSICAL EXAM:  Today's Vitals   02/02/23 1540  BP: 121/72  Pulse: 75  Weight: 185 lb 6.4 oz (84.1 kg)  Height: 6\' 3"  (1.905 m)   Body mass index is 23.17 kg/m.   Wt Readings from Last 3 Encounters:  02/02/23 185 lb 6.4 oz (84.1 kg)  11/26/22 185 lb (83.9 kg)  09/16/22 184 lb 3.2 oz (83.6 kg)     Ht Readings from Last 3 Encounters:  02/02/23 6\' 3"  (1.905 m)  11/26/22 6\' 2"  (1.88 m)   09/16/22 6\' 2"  (1.88 m)      General: The patient is awake, alert and appears not in acute distress. The patient is well groomed. Head: Normocephalic, atraumatic. Neck is supple. General: The patient is awake, alert and appears not in acute distress. The patient is well groomed. Head: Normocephalic, atraumatic. Neck is supple. Cardiovascular:  Regular rate and cardiac rhythm by pulse,  without distended neck veins. Respiratory: Lungs are clear to auscultation.  Skin:  Without evidence of ankle edema, or rash. Trunk: The patient's posture is erect.   NEUROLOGIC EXAM: The patient is awake and alert, oriented to place and time.   Memory subjective described as intact.  Attention span & concentration ability appears normal.  Speech is fluent,  with mild  dysphonia or aphasia.  Mood and affect are appropriate.   Cranial nerves:  loss of smell reported  Pupils are equal and briskly reactive to light. Extraocular movements in vertical and horizontal planes were intact and without nystagmus. No Diplopia. Visual fields by finger perimetry are intact. Hearing was intact to soft voice and finger rubbing.    Facial sensation intact to fine touch.  Facial motor strength is symmetric and tongue and uvula move midline.  Neck ROM : rotation, tilt and flexion extension were normal for age and shoulder shrug was symmetrical.    Motor exam:  Symmetric bulk, tone and ROM.   Normal tone with increased  cog wheeling, symmetric grip strength .   Sensory:  Fine touch, pinprick and vibration were tested  and  normal.  Proprioception tested in the upper extremities was normal.   Coordination: Rapid alternating movements in the fingers/hands were of reduced speed on the right .  The Finger-to-nose maneuver was intact without evidence of ataxia, and very little  tremor.   Gait and station: Patient could rise unassisted from a seated position, he walked without assistive device. Stooped posture.  Stance is  of normal width/ base  Deep tendon reflexes: in the  upper and lower extremities are symmetric and intact.   ASSESSMENT AND PLAN 63 y.o. year old male  here with:    1) Early PD with very subtile manifestations . Right leg stiffness and right shoulder rigidity, more biceps rigor  right over left, more tremor with action  right over left.   2) good posture, slightly reduced mimic.  Voice is full and volume is normal.   3) continue CPAP after ablation, atrial fib free.     I plan to follow up either personally or through our NP within 8-9 months.   I would like to thank Shawn Joe, Fields and Shawn Joe, Fields [address] [place],  [place] [address] for allowing me to  meet with and to take care of this pleasant patient.    After spending a total time of  30  minutes face to face and additional time for physical and neurologic examination, review of laboratory studies,  personal review of imaging studies, reports and results of other testing and review of referral information / records as far as provided in visit,   Electronically signed by: Shawn Novas, Fields 02/02/2023 4:06 PM  [facility] Board certified by The [employer] of Sleep Medicine and Diplomate of the [employer] of Sleep Medicine. Board certified In Neurology through the ABPN, Fellow of the [employer] of Neurology.

## 2023-02-02 NOTE — Patient Instructions (Signed)
ASSESSMENT AND PLAN 63 y.o. year old male  here with:    1) Early PD with very subtile manifestations . Right leg stiffness and right shoulder rigidity, more biceps rigor  right over left, more tremor with action  right over left.   2) good posture, slightly reduced mimic.  Voice is full and volume is normal.   3) continue CPAP after ablation, atrial fib free.     I plan to follow up either personally or through our NP within 8-9 months.   I would like to thank Tally Joe, MD and Tally Joe, Md [address] [place],  [place] [address] for allowing me to meet with and to take care of this pleasant patient.    After spending a total time of  30  minutes face to face and additional time for physical and neurologic examination, review of laboratory studies,  personal review of imaging studies, reports and results of other testing and review of referral information / records as far as provided in visit,   Electronically signed by: Melvyn Novas, MD 02/02/2023 4:06 PM

## 2023-03-11 DIAGNOSIS — G20A1 Parkinson's disease without dyskinesia, without mention of fluctuations: Secondary | ICD-10-CM | POA: Diagnosis not present

## 2023-03-11 DIAGNOSIS — Z0000 Encounter for general adult medical examination without abnormal findings: Secondary | ICD-10-CM | POA: Diagnosis not present

## 2023-03-11 DIAGNOSIS — J309 Allergic rhinitis, unspecified: Secondary | ICD-10-CM | POA: Diagnosis not present

## 2023-03-11 DIAGNOSIS — E7800 Pure hypercholesterolemia, unspecified: Secondary | ICD-10-CM | POA: Diagnosis not present

## 2023-04-21 DIAGNOSIS — L812 Freckles: Secondary | ICD-10-CM | POA: Diagnosis not present

## 2023-04-21 DIAGNOSIS — D2261 Melanocytic nevi of right upper limb, including shoulder: Secondary | ICD-10-CM | POA: Diagnosis not present

## 2023-04-21 DIAGNOSIS — D225 Melanocytic nevi of trunk: Secondary | ICD-10-CM | POA: Diagnosis not present

## 2023-04-21 DIAGNOSIS — G20A1 Parkinson's disease without dyskinesia, without mention of fluctuations: Secondary | ICD-10-CM | POA: Diagnosis not present

## 2023-04-21 DIAGNOSIS — L821 Other seborrheic keratosis: Secondary | ICD-10-CM | POA: Diagnosis not present

## 2023-04-21 DIAGNOSIS — L603 Nail dystrophy: Secondary | ICD-10-CM | POA: Diagnosis not present

## 2023-05-05 DIAGNOSIS — L119 Acantholytic disorder, unspecified: Secondary | ICD-10-CM | POA: Diagnosis not present

## 2023-05-05 DIAGNOSIS — L570 Actinic keratosis: Secondary | ICD-10-CM | POA: Diagnosis not present

## 2023-05-07 NOTE — Telephone Encounter (Signed)
 Eliquis  30 day free coupons has been discarded since patient hasn't picked it from our office.

## 2023-06-02 ENCOUNTER — Encounter: Payer: Self-pay | Admitting: Pulmonary Disease

## 2023-06-02 ENCOUNTER — Ambulatory Visit: Attending: Pulmonary Disease | Admitting: Pulmonary Disease

## 2023-06-02 VITALS — BP 113/77 | HR 63 | Ht 75.0 in | Wt 186.0 lb

## 2023-06-02 DIAGNOSIS — I480 Paroxysmal atrial fibrillation: Secondary | ICD-10-CM

## 2023-06-02 DIAGNOSIS — G4733 Obstructive sleep apnea (adult) (pediatric): Secondary | ICD-10-CM

## 2023-06-02 NOTE — Patient Instructions (Signed)
 Medication Instructions:  No medication changes were made during today's visit.  *If you need a refill on your cardiac medications before your next appointment, please call your pharmacy*   Lab Work: No labs were ordered during today's visit.  If you have labs (blood work) drawn today and your tests are completely normal, you will receive your results only by: MyChart Message (if you have MyChart) OR A paper copy in the mail If you have any lab test that is abnormal or we need to change your treatment, we will call you to review the results.   Testing/Procedures: No procedures were ordered during today's visit.    Follow-Up: At [facility], you and your health needs are our priority.  As part of our continuing mission to provide you with exceptional heart care, we have created designated Provider Care Teams.  These Care Teams include your primary Cardiologist (physician) and Advanced Practice Providers (APPs -  Physician Assistants and Nurse Practitioners) who all work together to provide you with the care you need, when you need it.  We recommend signing up for the patient portal called "MyChart".  Sign up information is provided on this After Visit Summary.  MyChart is used to connect with patients for Virtual Visits (Telemedicine).  Patients are able to view lab/test results, encounter notes, upcoming appointments, etc.  Non-urgent messages can be sent to your provider as well.   To learn more about what you can do with MyChart, go to [email or web address].    Your next appointment:   1 year(s)  Provider:   Creighton Doffing          Other Instructions Thank you for choosing [facility]! A letter will be mailed to you as a reminder to call the office for your next follow up appointment.

## 2023-06-02 NOTE — Progress Notes (Signed)
 Electrophysiology Office Note:   Date:  06/02/2023  ID:  Shawn Fields, DOB 1960/02/10, MRN [id number]  Primary Cardiologist: None Primary Heart Failure: None Electrophysiologist: Will Cortland Ding, MD      History of Present Illness:   Shawn Fields is a 63 y.o. male with h/o AF/AFL, Parkinson's disease, OSA seen today for routine electrophysiology followup.   He previously followed with Dr. Carolynne Citron & was referred to Dr. Lawana Pray for ablation. He was seen 11/26/22 by Dr. Lawana Pray and flecainide  was stopped given no recurrent symptoms post ablation. He has PRN cardizem  to use if recurrent symptoms.  Since last being seen in our clinic the patient reports he has not had any recurrent AF symptoms since his ablation. He continues to work out [employer], skis and plays pickle ball. He was diagnosed with Parkinson's disease. He is hopeful to avoid medications if possible due to his Parkinson's meds if ever needed for AF.   He denies chest pain, palpitations, dyspnea, PND, orthopnea, nausea, vomiting, dizziness, syncope, edema, weight gain, or early satiety.   Review of systems complete and found to be negative unless listed in HPI.   EP Information / Studies Reviewed:    EKG is ordered today. Personal review as below.  EKG Interpretation Date/Time:  Wednesday Jun 02 2023 11:16:09 EDT Ventricular Rate:  63 PR Interval:  170 QRS Duration:  88 QT Interval:  394 QTC Calculation: 403 R Axis:   73  Text Interpretation: Normal sinus rhythm Confirmed by Creighton Doffing ([id number]) on 06/02/2023 11:50:25 AM   Studies:  ECHO 10/2017 > LVEF 55%, G2DD, trivial MV regurgitation  CT Cardiac Morphology 07/2022 > normal PV drainage into the LA, CAC score 2 (30th percentile) EPS 08/2022 > SR on presentation, successful electrical isolation & anatomical encircling of all 4 PV's with RF current, additional LA ablation of posterior wall, ablation of typical atrial flutter   Arrhythmia / AAD AF /  AFL Flecainide  > on for approx 10 years then began having breakthrough episodes, stopped 11/2022 post ablation, changed to PRN only     Risk Assessment/Calculations:    CHA2DS2-VASc Score = 0   This indicates a 0.2% annual risk of stroke. The patient's score is based upon: CHF History: 0 HTN History: 0 Diabetes History: 0 Stroke History: 0 Vascular Disease History: 0 Age Score: 0 Gender Score: 0              Physical Exam:   VS:  BP 113/77 (BP Location: Right Arm)   Pulse 63   Ht 6\' 3"  (1.905 m)   Wt 186 lb (84.4 kg)   SpO2 98%   BMI 23.25 kg/m    Wt Readings from Last 3 Encounters:  06/02/23 186 lb (84.4 kg)  02/02/23 185 lb 6.4 oz (84.1 kg)  11/26/22 185 lb (83.9 kg)     GEN: pleasant, well nourished, well developed in no acute distress NECK: No JVD; No carotid bruits CARDIAC: Regular rate and rhythm, no murmurs, rubs, gallops RESPIRATORY:  Clear to auscultation without rales, wheezing or rhonchi  ABDOMEN: Soft, non-tender, non-distended EXTREMITIES:  No edema; No deformity   ASSESSMENT AND PLAN:    Paroxysmal Atrial Fibrillation  CHA2DS2-VASc 0 -continue cardizem  as PRN for breakthrough episodes, has not used  -no symptom burden off daily medications    -monitors with an Apple Watch  -discussed avoidance of risk factors / risk modification > limiting ETOH, continue to exercise, currently at healthy weight   OSA  -CPAP compliant  Follow up with Dr. Lawana Pray or EP APP  in 12 months or sooner if new symptoms  Signed, Creighton Doffing, NP-C, AGACNP-BC [facility] - Electrophysiology  06/02/2023, 12:43 PM

## 2023-07-20 DIAGNOSIS — Z23 Encounter for immunization: Secondary | ICD-10-CM | POA: Diagnosis not present

## 2023-09-01 DIAGNOSIS — Z23 Encounter for immunization: Secondary | ICD-10-CM | POA: Diagnosis not present

## 2023-11-02 ENCOUNTER — Encounter: Payer: Self-pay | Admitting: Neurology

## 2023-11-02 ENCOUNTER — Other Ambulatory Visit: Payer: Self-pay

## 2023-11-02 ENCOUNTER — Ambulatory Visit: Payer: BC Managed Care – PPO | Admitting: Neurology

## 2023-11-02 VITALS — BP 121/76 | HR 71 | Ht 75.0 in | Wt 181.0 lb

## 2023-11-02 DIAGNOSIS — G20A1 Parkinson's disease without dyskinesia, without mention of fluctuations: Secondary | ICD-10-CM

## 2023-11-02 DIAGNOSIS — G4733 Obstructive sleep apnea (adult) (pediatric): Secondary | ICD-10-CM

## 2023-11-02 MED ORDER — RASAGILINE MESYLATE 1 MG PO TABS: 1.0000 mg | ORAL_TABLET | Freq: Every day | ORAL | 5 refills | Status: AC

## 2023-11-02 NOTE — Patient Instructions (Signed)
 Rasagiline  Tablets What is this medication? RASAGILINE  (ra SA ji leen) treats the symptoms of Parkinson disease. It works by increasing the amount of dopamine in your brain, a substance which helps manage body movements and coordination. This reduces the symptoms of Parkinson, such as body stiffness and tremors. It belongs to a group of medications called MAOIs. This medicine may be used for other purposes; ask your health care provider or pharmacist if you have questions. COMMON BRAND NAME(S): Azilect  What should I tell my care team before I take this medication? They need to know if you have any of these conditions: Frequently drink alcohol High blood pressure Liver disease Low blood pressure Mental health conditions Sleep disorders Urges to engage in impulsive behaviors in ways that are unusual for you An unusual or allergic reaction to rasagiline , other medications, foods, dyes, or preservatives Pregnant or trying to get pregnant Breast-feeding How should I use this medication? Take this medication by mouth with water. Take it as directed on the prescription label at the same time every day. You can take it with or without food. If it upsets your stomach, take it with food. Keep taking it unless your care team tells you to stop. Talk to your care team about the use of this medication in children. Special care may be needed. Overdosage: If you think you have taken too much of this medicine contact a poison control center or emergency room at once. NOTE: This medicine is only for you. Do not share this medicine with others. What if I miss a dose? If you miss a dose, take it as soon as you can. If it is almost time for your next dose, take only that dose. Do not take double or extra doses. What may interact with this medication? Do not take this medication with any of the following: Cyclobenzaprine Dextromethorphan Linezolid Other MAOIs, such as Marplan, Nardil, and Parnate Methylene  blue Opioids Stimulant medications for ADHD, weight loss, or staying awake Supplements, such as St. John's wort or tryptophan This medication may also interact with the following: Alcohol Antihistamines for allergy, cough and cold Certain medications for depression, anxiety, or other mental health conditions Ciprofloxacin Decongestants, including nasal sprays or eye drops Isoniazid Medications that help you fall asleep Metoclopramide This list may not describe all possible interactions. Give your health care provider a list of all the medicines, herbs, non-prescription drugs, or dietary supplements you use. Also tell them if you smoke, drink alcohol, or use illegal drugs. Some items may interact with your medicine. What should I watch for while using this medication? Visit your care team for regular checks on your progress. Tell your care team if your symptoms do not start to get better or if they get worse. Do not stop taking except on your care team's advice. You may develop a severe reaction. Your care team will tell you how much medication to take. This medication may affect your coordination, reaction time, or judgement. Do not drive or operate machinery until you know how this medication affects you. Sit up or stand slowly to reduce the risk of dizzy or fainting spells. Drinking alcohol with this medication can increase the risk of these side effects. When taking this medication, you may fall asleep without notice. You may be doing activities like driving a car, talking, or eating. You may not feel drowsy before it happens. Contact your care team right away if this happens to you. There have been reports of increased sexual urges or  other strong urges such as gambling while taking this medication. If you experience any of these while taking this medication, you should report this to your care team as soon as possible. Foods that contain very high amounts of tyramine, such as aged, fermented,  cured, smoked and pickled foods, should be avoided while taking this medication. The combination may cause a dangerous rise in blood pressure. Ask your care team, pharmacist, or nutritionist for a complete listing of foods and beverages that are high in tyramine. If you consume a food or beverage very rich in tyramine and do not feel well soon after eating, contact your care team. Some medications may interact with this medication and could cause adverse effects. Talk to your care team if you are taking or planning to take any over-the-counter medications, especially cough remedies or decongestants, including nasal sprays or eye drops. This medication may also interact with antidepressants and certain medications for pain. Contact your care team before taking new medications including antidepressants, pain medications, or prescription or over-the-counter medications for congestion, cough, colds, or allergies. If you are scheduled for any medical or dental procedure, tell your care team that you are taking this medication. This medication can interact with other medications used during surgery. What side effects may I notice from receiving this medication? Side effects that you should report to your care team as soon as possible: Allergic reactions--skin rash, itching, hives, swelling of the face, lips, tongue, or throat Falling asleep during daily activities Increase in blood pressure Low blood pressure--dizziness, feeling faint or lightheaded, blurry vision Mood and behavior changes--anxiety, nervousness, irritability and restlessness, confusion, hallucinations, feeling distrust or suspicion of others New or worsening uncontrolled and repetitive movements of the face, mouth, or upper body Urges to engage in impulsive behaviors such as gambling, binge eating, sexual activity, or shopping in ways that are unusual for you Side effects that usually do not require medical attention (report to your care team  if they continue or are bothersome): Flu-like symptoms--fever, chills, muscle pain, cough, headache, fatigue Joint pain Upset stomach This list may not describe all possible side effects. Call your doctor for medical advice about side effects. You may report side effects to FDA at 1-800-FDA-1088. Where should I keep my medication? Keep out of the reach of children and pets. Store at room temperature between 20 and 25 degrees C (68 and 77 degrees F). Get rid of any unused medication after the expiration date. To get rid of medications that are no longer needed or have expired: Take the medication to a take-back program. Check with your pharmacy or law enforcement to find a location. If you cannot return the medication, check the label or package insert to see if the medication should be thrown out in the garbage or flushed down the toilet. If you are not sure, ask your care team. If it is safe to put it in the trash, empty the medication out of the container. Mix the medication with cat litter, dirt, coffee grounds, or other unwanted substance. Seal the mixture in a bag or container. Put it in the trash. NOTE: This sheet is a summary. It may not cover all possible information. If you have questions about this medicine, talk to your doctor, pharmacist, or health care provider.  2024 Elsevier/Gold Standard (2021-04-09 00:00:00)

## 2023-11-02 NOTE — Progress Notes (Addendum)
 Provider:  Dedra Gores, MD  Primary Care Physician:  Seabron Lenis, MD [address] [place] [address]     Referring Provider: Seabron Lenis, Md [address] [place],  [place] [address]          Chief Complaint according to patient   Patient presents with:      ESS 2./ 24  FSS at  23 / 63       Right arm less limber,  loss of arm swing,  here with wife who contributes observational information  important for this visit.   She witnessed nocturnal movements , almost a tremor in the right arm and shoulder . This is atypical for PD when  tremor tends to rest in sleep.        HISTORY OF PRESENT ILLNESS:  Shawn Fields is a 63 y.o. male patient who is here for revisit 11/02/2023 for:.OSA on CPAP.  Highly compliant on CPAP , good resolution  of apnea .  2) PD on rasiligine and doing well.   A little more pronounced right arm loss of arm swing, tremor. Sleep has been more restless. Continue Boxing, walking 10 K steps a day.  Healthy eating: yes      Chief concern according to patient :        Fam Hx : see previous note  Social HX; see previous note       Review of Systems: Out of a complete 14 system review, the patient complains of only the following symptoms, and all other reviewed systems are negative.:   SLEEPINESS ?  How likely are you to doze in the following situations: 0 = not likely, 1 = slight chance, 2 = moderate chance, 3 = high chance  Sitting and Reading? Watching Television? Sitting inactive in a public place (theater or meeting)? Lying down in the afternoon when circumstances permit? Sitting and talking to someone? Sitting quietly after lunch without alcohol? In a car, while stopped for a few minutes in traffic? As a passenger in a car for an hour without a break?  Total =  2/ 24 on CPAP        Social History   Socioeconomic History   Marital status: Married    Spouse name: Montie    Number of children: 2   Years of education: 16   Highest education level: Bachelor's degree (e.g., BA, AB, BS)  Occupational History   Occupation: [employer]   Tobacco Use   Smoking status: Never   Smokeless tobacco: Never  Vaping Use   Vaping status: Never Used  Substance and Sexual Activity   Alcohol use: Yes    Comment: social   Drug use: No   Sexual activity: Yes  Other Topics Concern   Not on file  Social History Narrative   Lives at home w wife   Drinks 1 cup a caffeine a day    Left handed   Social Drivers of Corporate investment banker Strain: Not on file  Food Insecurity: Not on file  Transportation Needs: Not on file  Physical Activity: Not on file  Stress: Not on file  Social Connections: Not on file    Family History  Problem Relation Age of Onset   Parkinson's disease Mother    Heart attack Father    Heart disease Father    Arrhythmia Brother     Past Medical History:  Diagnosis Date   Allergic rhinitis    Epididymal cyst    h/o    H/O actinic keratosis    Treated sith Aldara periodically by Dr Cary.   H/O ganglion cyst    in the left wrist that resolved.   Onychomycosis    treated w lamisil   OSA (obstructive sleep apnea)    uses CPAP nightly   PAF (paroxysmal atrial fibrillation) (HCC)    w CHADS VASC score 0-on ASA    Past Surgical History:  Procedure Laterality Date   ATRIAL FIBRILLATION ABLATION N/A 08/14/2022   Procedure: ATRIAL FIBRILLATION ABLATION;  Surgeon: Inocencio Soyla Lunger, MD;  Location: [facility];  Service: Cardiovascular;  Laterality: N/A;   COLONOSCOPY     HERNIA REPAIR     INGUINAL HERNIA REPAIR Right 04/22/2022   Procedure: OPEN RIGHT INGUINAL HERNIA REPAIR WITH MESH;  Surgeon: Vanderbilt Ned, MD;  Location: [facility];  Service: General;  Laterality: Right;   SHOULDER ARTHROSCOPY Right      Current Outpatient Medications on File Prior to Visit  Medication Sig Dispense Refill   NON  FORMULARY CPAP     rasagiline  (AZILECT ) 1 MG TABS tablet Take 1 tablet (1 mg total) by mouth daily. 30 tablet 5   silver sulfADIAZINE (SILVADENE) 1 % cream Apply 1 Application topically daily.     No current facility-administered medications on file prior to visit.    No Known Allergies   DIAGNOSTIC DATA (LABS, IMAGING, TESTING) - I reviewed patient records, labs, notes, testing and imaging myself where available.  Lab Results  Component Value Date   WBC 6.0 07/23/2022   HGB 12.7 (L) 07/23/2022   HCT 38.2 07/23/2022   MCV 92 07/23/2022   PLT 259 07/23/2022      Component Value Date/Time   NA 140 07/23/2022 1402   K 4.3 07/23/2022 1402   CL 103 07/23/2022 1402   CO2 24 07/23/2022 1402   GLUCOSE 95 07/23/2022 1402   GLUCOSE 79 12/16/2007 1231   BUN 19 07/23/2022 1402   CREATININE 1.24 07/23/2022 1402   CALCIUM 9.1 07/23/2022 1402   GFRNONAA >60 12/16/2007 1231   GFRAA  12/16/2007 1231    >60        The eGFR has been calculated using the MDRD equation. This calculation has not been validated in all clinical   Lab Results  Component Value Date   CHOL 164 06/11/2016   HDL 48 06/11/2016   LDLCALC 94 06/11/2016   TRIG 110 06/11/2016   CHOLHDL 3.4 06/11/2016   Lab Results  Component Value Date   HGBA1C 5.3 07/30/2014   No results found for: VITAMINB12 No results found for: TSH  PHYSICAL EXAM:  Vitals:   11/02/23 1529  BP: 121/76  Pulse: 71   No data found. Body mass index is 22.62 kg/m.   Wt Readings from Last 3 Encounters:  11/02/23 181 lb (82.1 kg)  06/02/23 186 lb (84.4 kg)  02/02/23 185 lb 6.4 oz (84.1 kg)     Ht Readings from Last 3 Encounters:  11/02/23 6' 3 (1.905 m)  06/02/23 6' 3 (1.905 m)  02/02/23 6' 3 (1.905 m)      General: The patient is awake, alert and appears not in acute distress and groomed. Head: Normocephalic, atraumatic.  Neck is supple. Cardiovascular:  Regular rate and cardiac rhythm by pulse, without distended neck  veins. Respiratory: no shortness of breath  Skin:  Without evidence of ankle edema,  or rash. Trunk: BMI is 22. 6     NEUROLOGIC EXAM: The patient is awake and alert, oriented to place and time.   Memory subjective described as intact.  Attention span & concentration ability appears normal.   Speech is fluent,  without  dysarthria, no dysphonia or aphasia.  Mood and affect are appropriate.   Neurological Examination: Mental Status: Intact.  Language and speech are normal.  No cognitive deficits. Cranial Nerves II-XII: Intact.  PERL. EOMI. VFF.  No nystagmus.  No facial droop.  No ptosis.  Hearing is grossly intact bilaterally.  The tongue is normal and midline. Facial masking is evident -  No facial droop.   Motor: Strengths are 5/5 throughout.  Good grip strength,  mild amplitude resting tremor,  Right over left .  Right shoulder droop, right biceps with very mild cogwheeling,  repeat movements is reducing the tremor.   Reflexes: Normal and symmetric throughout. No ankle clonus.  Gait and Station: Normal posture,  loss of right armswing.  Fragmented turns.   Romberg's sign is absent.     ASSESSMENT AND PLAN :   63 y.o. year old male  here with:    1) OSA on CPAP, excellent compliance and control under only 7 cm water CPAP pressure.  2)  PD:  very mild progression, right arm slightly droopy(, shoulder is lower ) and  loss of  arm swing.   3) next visit in 12 months with full PD exam and for regular CPAP compliance - please schedule at  alternate  times with Dr Rosalia.   I would like to thank  Seabron Lenis, Md [address] [place],  [place] [address] for allowing me to meet with this pleasant patient.    The referring provider will be notified of the test results.   The patient's condition requires frequent monitoring and adjustments in the treatment plan, reflecting the ongoing complexity of care.  This provider is the continuing focal point  for all needed services for this condition.  After spending a total time of 23 minutes face to face and time for  history taking, physical and neurologic examination, review of laboratory studies,  personal review of imaging studies, reports and results of other testing and review of referral information / records as far as provided in visit,   Electronically signed by: Dedra Gores, MD 11/02/2023 3:50 PM  [facility] Board certified by The [employer] of Sleep Medicine and Diplomate of the [employer] of Sleep Medicine. Board certified In Neurology through the ABPN, Fellow of the [employer] of Neu[employer].

## 2023-11-03 ENCOUNTER — Ambulatory Visit: Payer: Self-pay | Admitting: Neurology

## 2023-11-03 LAB — COMPREHENSIVE METABOLIC PANEL WITH GFR
ALT: 11 IU/L (ref 0–44)
AST: 14 IU/L (ref 0–40)
Albumin: 4 g/dL (ref 3.9–4.9)
Alkaline Phosphatase: 71 IU/L (ref 47–123)
BUN/Creatinine Ratio: 18 (ref 10–24)
BUN: 19 mg/dL (ref 8–27)
Bilirubin Total: 0.4 mg/dL (ref 0.0–1.2)
CO2: 24 mmol/L (ref 20–29)
Calcium: 9 mg/dL (ref 8.6–10.2)
Chloride: 105 mmol/L (ref 96–106)
Creatinine, Ser: 1.06 mg/dL (ref 0.76–1.27)
Globulin, Total: 2.3 g/dL (ref 1.5–4.5)
Glucose: 85 mg/dL (ref 70–99)
Potassium: 4.3 mmol/L (ref 3.5–5.2)
Sodium: 139 mmol/L (ref 134–144)
Total Protein: 6.3 g/dL (ref 6.0–8.5)
eGFR: 79 mL/min/1.73 (ref >59)

## 2023-12-20 DIAGNOSIS — L738 Other specified follicular disorders: Secondary | ICD-10-CM | POA: Diagnosis not present

## 2023-12-20 DIAGNOSIS — L570 Actinic keratosis: Secondary | ICD-10-CM | POA: Diagnosis not present

## 2023-12-20 DIAGNOSIS — L821 Other seborrheic keratosis: Secondary | ICD-10-CM | POA: Diagnosis not present

## 2024-02-02 ENCOUNTER — Telehealth: Payer: Self-pay

## 2024-02-02 DIAGNOSIS — Z820 Family history of epilepsy and other diseases of the nervous system: Secondary | ICD-10-CM

## 2024-02-02 DIAGNOSIS — I480 Paroxysmal atrial fibrillation: Secondary | ICD-10-CM

## 2024-02-02 DIAGNOSIS — I4821 Permanent atrial fibrillation: Secondary | ICD-10-CM

## 2024-02-02 NOTE — Addendum Note (Signed)
 Addended by: ROBBERT GOSLING D on: 02/02/2024 01:40 PM   Modules accepted: Orders

## 2024-02-02 NOTE — Addendum Note (Signed)
 Addended by: CHALICE SAUNAS on: 02/02/2024 04:22 PM   Modules accepted: Orders

## 2024-02-02 NOTE — Telephone Encounter (Signed)
 He needs a HST , no visit needed.

## 2024-02-02 NOTE — Telephone Encounter (Signed)
 Patient called and LVM about needing a new sleep study for Dr. Melba to obtain a new dental device. Dr. Chalice referred patient to Dr. Melba and Dr. Melba is now requesting an updated SS. Pt last SS was in 2022 - can we order a sleep study for him or does he need an office visit? Please let me know and I can reach back out to the patient. Thanks!

## 2024-02-02 NOTE — Telephone Encounter (Signed)
 Pt has been seen recently in October 2025. Order for HST pended for MD approval if she wishes to move forward.

## 2024-02-17 ENCOUNTER — Telehealth: Payer: Self-pay | Admitting: *Deleted

## 2024-05-23 ENCOUNTER — Ambulatory Visit: Admitting: Pulmonary Disease

## 2024-11-07 ENCOUNTER — Ambulatory Visit: Admitting: Neurology
